# Patient Record
Sex: Female | Born: 1970 | Race: Black or African American | Hispanic: No | Smoking: Never smoker
Health system: Southern US, Community
[De-identification: ages and names within clinical notes are randomized; demographics above are authoritative.]

## PROBLEM LIST (undated history)

## (undated) DIAGNOSIS — D649 Anemia, unspecified: Secondary | ICD-10-CM

## (undated) HISTORY — DX: Anemia, unspecified: D64.9

## (undated) HISTORY — PX: TUBAL LIGATION: SHX77

## (undated) HISTORY — PX: BREAST REDUCTION SURGERY: SHX8

---

## 1996-11-27 HISTORY — PX: CHOLECYSTECTOMY: SHX55

## 2002-07-01 ENCOUNTER — Emergency Department (HOSPITAL_COMMUNITY): Admission: EM | Admit: 2002-07-01 | Discharge: 2002-07-01 | Payer: Self-pay | Admitting: Emergency Medicine

## 2002-09-13 ENCOUNTER — Emergency Department (HOSPITAL_COMMUNITY): Admission: EM | Admit: 2002-09-13 | Discharge: 2002-09-14 | Payer: Self-pay

## 2002-10-21 ENCOUNTER — Emergency Department (HOSPITAL_COMMUNITY): Admission: EM | Admit: 2002-10-21 | Discharge: 2002-10-21 | Payer: Self-pay | Admitting: Emergency Medicine

## 2002-10-21 ENCOUNTER — Encounter: Payer: Self-pay | Admitting: Emergency Medicine

## 2003-03-22 ENCOUNTER — Encounter: Payer: Self-pay | Admitting: Emergency Medicine

## 2003-03-22 ENCOUNTER — Emergency Department (HOSPITAL_COMMUNITY): Admission: EM | Admit: 2003-03-22 | Discharge: 2003-03-22 | Payer: Self-pay | Admitting: Emergency Medicine

## 2003-05-29 ENCOUNTER — Other Ambulatory Visit: Admission: RE | Admit: 2003-05-29 | Discharge: 2003-05-29 | Payer: Self-pay | Admitting: Obstetrics and Gynecology

## 2003-06-08 ENCOUNTER — Emergency Department (HOSPITAL_COMMUNITY): Admission: EM | Admit: 2003-06-08 | Discharge: 2003-06-08 | Payer: Self-pay | Admitting: Emergency Medicine

## 2003-11-29 ENCOUNTER — Emergency Department (HOSPITAL_COMMUNITY): Admission: EM | Admit: 2003-11-29 | Discharge: 2003-11-29 | Payer: Self-pay

## 2004-08-03 ENCOUNTER — Inpatient Hospital Stay (HOSPITAL_COMMUNITY): Admission: AD | Admit: 2004-08-03 | Discharge: 2004-08-03 | Payer: Self-pay | Admitting: Obstetrics & Gynecology

## 2005-09-18 ENCOUNTER — Emergency Department (HOSPITAL_COMMUNITY): Admission: EM | Admit: 2005-09-18 | Discharge: 2005-09-18 | Payer: Self-pay | Admitting: *Deleted

## 2006-05-16 ENCOUNTER — Emergency Department (HOSPITAL_COMMUNITY): Admission: EM | Admit: 2006-05-16 | Discharge: 2006-05-17 | Payer: Self-pay | Admitting: Emergency Medicine

## 2007-04-11 ENCOUNTER — Emergency Department (HOSPITAL_COMMUNITY): Admission: EM | Admit: 2007-04-11 | Discharge: 2007-04-11 | Payer: Self-pay | Admitting: Emergency Medicine

## 2008-02-28 ENCOUNTER — Emergency Department (HOSPITAL_COMMUNITY): Admission: EM | Admit: 2008-02-28 | Discharge: 2008-02-28 | Payer: Self-pay | Admitting: Emergency Medicine

## 2009-06-15 ENCOUNTER — Emergency Department (HOSPITAL_COMMUNITY): Admission: EM | Admit: 2009-06-15 | Discharge: 2009-06-16 | Payer: Self-pay | Admitting: Emergency Medicine

## 2009-06-17 ENCOUNTER — Emergency Department (HOSPITAL_COMMUNITY): Admission: EM | Admit: 2009-06-17 | Discharge: 2009-06-17 | Payer: Self-pay | Admitting: Emergency Medicine

## 2009-11-22 ENCOUNTER — Emergency Department (HOSPITAL_COMMUNITY): Admission: EM | Admit: 2009-11-22 | Discharge: 2009-11-23 | Payer: Self-pay | Admitting: Emergency Medicine

## 2010-05-02 ENCOUNTER — Other Ambulatory Visit: Payer: Self-pay

## 2010-05-02 ENCOUNTER — Emergency Department (HOSPITAL_COMMUNITY): Admission: EM | Admit: 2010-05-02 | Discharge: 2010-05-02 | Payer: Self-pay | Admitting: Emergency Medicine

## 2010-05-02 ENCOUNTER — Other Ambulatory Visit: Payer: Self-pay | Admitting: Emergency Medicine

## 2010-11-23 ENCOUNTER — Inpatient Hospital Stay (HOSPITAL_COMMUNITY)
Admission: AD | Admit: 2010-11-23 | Discharge: 2010-11-23 | Payer: Self-pay | Source: Home / Self Care | Attending: Obstetrics & Gynecology | Admitting: Obstetrics & Gynecology

## 2011-02-06 LAB — CBC
HCT: 35.1 % — ABNORMAL LOW (ref 36.0–46.0)
MCH: 28.8 pg (ref 26.0–34.0)
MCV: 88.6 fL (ref 78.0–100.0)
Platelets: 304 10*3/uL (ref 150–400)
RBC: 3.96 MIL/uL (ref 3.87–5.11)
WBC: 5.5 10*3/uL (ref 4.0–10.5)

## 2011-02-06 LAB — URINALYSIS, ROUTINE W REFLEX MICROSCOPIC
Hgb urine dipstick: NEGATIVE
Ketones, ur: NEGATIVE mg/dL
Specific Gravity, Urine: 1.015 (ref 1.005–1.030)

## 2011-02-06 LAB — WET PREP, GENITAL

## 2011-02-06 LAB — POCT PREGNANCY, URINE: Preg Test, Ur: NEGATIVE

## 2011-02-06 LAB — GC/CHLAMYDIA PROBE AMP, GENITAL: Chlamydia, DNA Probe: NEGATIVE

## 2011-02-13 LAB — WET PREP, GENITAL: Trich, Wet Prep: NONE SEEN

## 2011-02-13 LAB — DIFFERENTIAL
Basophils Absolute: 0.1 10*3/uL (ref 0.0–0.1)
Basophils Relative: 2 % — ABNORMAL HIGH (ref 0–1)
Eosinophils Absolute: 0.1 10*3/uL (ref 0.0–0.7)
Lymphocytes Relative: 40 % (ref 12–46)
Lymphs Abs: 2.9 10*3/uL (ref 0.7–4.0)
Monocytes Relative: 10 % (ref 3–12)
Neutrophils Relative %: 48 % (ref 43–77)

## 2011-02-13 LAB — POCT I-STAT, CHEM 8
BUN: 7 mg/dL (ref 6–23)
Creatinine, Ser: 0.7 mg/dL (ref 0.4–1.2)
Glucose, Bld: 92 mg/dL (ref 70–99)
HCT: 35 % — ABNORMAL LOW (ref 36.0–46.0)
TCO2: 27 mmol/L (ref 0–100)

## 2011-02-13 LAB — URINALYSIS, ROUTINE W REFLEX MICROSCOPIC
Ketones, ur: NEGATIVE mg/dL
Protein, ur: NEGATIVE mg/dL
Specific Gravity, Urine: 1.01 (ref 1.005–1.030)

## 2011-02-13 LAB — CBC
Platelets: 326 10*3/uL (ref 150–400)
RBC: 3.72 MIL/uL — ABNORMAL LOW (ref 3.87–5.11)
WBC: 7.3 10*3/uL (ref 4.0–10.5)

## 2011-02-27 LAB — DIFFERENTIAL
Basophils Absolute: 0.1 10*3/uL (ref 0.0–0.1)
Basophils Relative: 1 % (ref 0–1)
Eosinophils Relative: 0 % (ref 0–5)
Lymphs Abs: 1.2 10*3/uL (ref 0.7–4.0)
Monocytes Absolute: 0.5 10*3/uL (ref 0.1–1.0)
Monocytes Relative: 6 % (ref 3–12)

## 2011-02-27 LAB — URINALYSIS, ROUTINE W REFLEX MICROSCOPIC
Glucose, UA: NEGATIVE mg/dL
Hgb urine dipstick: NEGATIVE
Protein, ur: NEGATIVE mg/dL
pH: 6 (ref 5.0–8.0)

## 2011-02-27 LAB — COMPREHENSIVE METABOLIC PANEL
Albumin: 3.4 g/dL — ABNORMAL LOW (ref 3.5–5.2)
CO2: 27 mEq/L (ref 19–32)
Chloride: 103 mEq/L (ref 96–112)
GFR calc Af Amer: >60 mL/min (ref >60)
GFR calc non Af Amer: >60 mL/min (ref >60)
Total Bilirubin: 0.7 mg/dL (ref 0.3–1.2)

## 2011-02-27 LAB — CBC
MCHC: 33.6 g/dL (ref 30.0–36.0)
MCV: 88 fL (ref 78.0–100.0)
Platelets: 305 10*3/uL (ref 150–400)

## 2011-02-27 LAB — POCT PREGNANCY, URINE: Preg Test, Ur: NEGATIVE

## 2011-03-05 LAB — COMPREHENSIVE METABOLIC PANEL WITH GFR
ALT: 16 U/L (ref 0–35)
AST: 24 U/L (ref 0–37)
Albumin: 3.6 g/dL (ref 3.5–5.2)
Alkaline Phosphatase: 72 U/L (ref 39–117)
BUN: 5 mg/dL — ABNORMAL LOW (ref 6–23)
CO2: 27 meq/L (ref 19–32)
Calcium: 9.2 mg/dL (ref 8.4–10.5)
Chloride: 104 meq/L (ref 96–112)
Creatinine, Ser: 0.78 mg/dL (ref 0.4–1.2)
GFR calc non Af Amer: >60 mL/min
Glucose, Bld: 103 mg/dL — ABNORMAL HIGH (ref 70–99)
Potassium: 4 meq/L (ref 3.5–5.1)
Sodium: 138 meq/L (ref 135–145)
Total Bilirubin: 0.3 mg/dL (ref 0.3–1.2)
Total Protein: 7.5 g/dL (ref 6.0–8.3)

## 2011-03-05 LAB — URINALYSIS, ROUTINE W REFLEX MICROSCOPIC
Glucose, UA: NEGATIVE mg/dL
Glucose, UA: NEGATIVE mg/dL
Specific Gravity, Urine: 1.008 (ref 1.005–1.030)
Urobilinogen, UA: 1 mg/dL (ref 0.0–1.0)
pH: 6.5 (ref 5.0–8.0)

## 2011-03-05 LAB — DIFFERENTIAL
Basophils Absolute: 0.1 K/uL (ref 0.0–0.1)
Basophils Relative: 1 % (ref 0–1)
Eosinophils Absolute: 0.1 K/uL (ref 0.0–0.7)
Eosinophils Relative: 1 % (ref 0–5)
Eosinophils Relative: 1 % (ref 0–5)
Lymphocytes Relative: 34 % (ref 12–46)
Lymphocytes Relative: 35 % (ref 12–46)
Lymphs Abs: 2.8 10*3/uL (ref 0.7–4.0)
Lymphs Abs: 3.7 K/uL (ref 0.7–4.0)
Monocytes Absolute: 0.9 K/uL (ref 0.1–1.0)
Monocytes Relative: 8 % (ref 3–12)
Neutro Abs: 4.7 10*3/uL (ref 1.7–7.7)
Neutro Abs: 5.8 K/uL (ref 1.7–7.7)
Neutrophils Relative %: 55 % (ref 43–77)
Neutrophils Relative %: 58 % (ref 43–77)

## 2011-03-05 LAB — URINE CULTURE
Colony Count: NO GROWTH
Culture: NO GROWTH

## 2011-03-05 LAB — CBC
HCT: 35.8 % — ABNORMAL LOW (ref 36.0–46.0)
Hemoglobin: 11.8 g/dL — ABNORMAL LOW (ref 12.0–15.0)
MCHC: 33 g/dL (ref 30.0–36.0)
MCHC: 33.1 g/dL (ref 30.0–36.0)
MCV: 88.7 fL (ref 78.0–100.0)
MCV: 88.8 fL (ref 78.0–100.0)
Platelets: 374 10*3/uL (ref 150–400)
RBC: 3.85 MIL/uL — ABNORMAL LOW (ref 3.87–5.11)
RBC: 4.03 MIL/uL (ref 3.87–5.11)

## 2011-03-05 LAB — BASIC METABOLIC PANEL WITH GFR
BUN: 8 mg/dL (ref 6–23)
CO2: 29 meq/L (ref 19–32)
Calcium: 9.3 mg/dL (ref 8.4–10.5)
Chloride: 102 meq/L (ref 96–112)
Creatinine, Ser: 0.74 mg/dL (ref 0.4–1.2)
GFR calc non Af Amer: >60 mL/min
Glucose, Bld: 106 mg/dL — ABNORMAL HIGH (ref 70–99)
Potassium: 5.2 meq/L — ABNORMAL HIGH (ref 3.5–5.1)
Sodium: 137 meq/L (ref 135–145)

## 2011-03-05 LAB — URINE MICROSCOPIC-ADD ON

## 2011-03-05 LAB — PREGNANCY, URINE: Preg Test, Ur: NEGATIVE

## 2011-06-28 ENCOUNTER — Emergency Department (HOSPITAL_COMMUNITY)
Admission: EM | Admit: 2011-06-28 | Discharge: 2011-06-28 | Disposition: A | Discharge status: Home / Self Care | Payer: Self-pay | Attending: Emergency Medicine | Admitting: Emergency Medicine

## 2011-06-28 DIAGNOSIS — R109 Unspecified abdominal pain: Secondary | ICD-10-CM | POA: Insufficient documentation

## 2011-06-28 DIAGNOSIS — N390 Urinary tract infection, site not specified: Secondary | ICD-10-CM | POA: Insufficient documentation

## 2011-06-28 DIAGNOSIS — R110 Nausea: Secondary | ICD-10-CM | POA: Insufficient documentation

## 2011-06-28 DIAGNOSIS — R300 Dysuria: Secondary | ICD-10-CM | POA: Insufficient documentation

## 2011-06-28 LAB — BASIC METABOLIC PANEL
CO2: 29 mEq/L (ref 19–32)
Chloride: 100 mEq/L (ref 96–112)
Creatinine, Ser: 0.6 mg/dL (ref 0.50–1.10)
GFR calc Af Amer: >60 mL/min (ref >60)
Potassium: 3.5 mEq/L (ref 3.5–5.1)

## 2011-06-28 LAB — URINALYSIS, ROUTINE W REFLEX MICROSCOPIC
Glucose, UA: NEGATIVE mg/dL
Hgb urine dipstick: NEGATIVE
Ketones, ur: NEGATIVE mg/dL
Protein, ur: NEGATIVE mg/dL

## 2011-06-28 LAB — CBC
HCT: 34.7 % — ABNORMAL LOW (ref 36.0–46.0)
MCV: 87.6 fL (ref 78.0–100.0)
Platelets: 382 10*3/uL (ref 150–400)
RBC: 3.96 MIL/uL (ref 3.87–5.11)
WBC: 9.7 10*3/uL (ref 4.0–10.5)

## 2011-06-28 LAB — DIFFERENTIAL
Basophils Absolute: 0 10*3/uL (ref 0.0–0.1)
Eosinophils Absolute: 0.1 10*3/uL (ref 0.0–0.7)
Lymphocytes Relative: 36 % (ref 12–46)
Lymphs Abs: 3.5 10*3/uL (ref 0.7–4.0)
Neutrophils Relative %: 55 % (ref 43–77)

## 2011-06-28 LAB — URINE MICROSCOPIC-ADD ON

## 2011-06-30 LAB — URINE CULTURE
Colony Count: >=100000
Culture  Setup Time: 201208012137

## 2011-08-22 LAB — URINALYSIS, ROUTINE W REFLEX MICROSCOPIC
Bilirubin Urine: NEGATIVE
Glucose, UA: NEGATIVE
Hgb urine dipstick: NEGATIVE
Ketones, ur: NEGATIVE
Specific Gravity, Urine: 1.018
pH: 6

## 2011-08-22 LAB — COMPREHENSIVE METABOLIC PANEL
ALT: 17
BUN: 6
CO2: 29
Calcium: 9.6
GFR calc non Af Amer: >60
Glucose, Bld: 71
Sodium: 140
Total Protein: 7.5

## 2011-08-22 LAB — CBC
HCT: 36.1
Hemoglobin: 12.1
MCHC: 33.6
MCV: 87.2
RBC: 4.14
RDW: 12.6

## 2011-08-22 LAB — DIFFERENTIAL
Basophils Relative: 1
Eosinophils Absolute: 0.1
Lymphs Abs: 2.6
Monocytes Relative: 9
Neutro Abs: 2.4
Neutrophils Relative %: 43

## 2011-08-22 LAB — PREGNANCY, URINE: Preg Test, Ur: NEGATIVE

## 2011-08-22 LAB — LIPASE, BLOOD: Lipase: 27

## 2011-08-25 ENCOUNTER — Encounter (HOSPITAL_COMMUNITY): Payer: Self-pay | Admitting: *Deleted

## 2011-08-25 ENCOUNTER — Inpatient Hospital Stay (HOSPITAL_COMMUNITY)
Admission: AD | Admit: 2011-08-25 | Discharge: 2011-08-25 | Disposition: A | Discharge status: Home / Self Care | Payer: Self-pay | Source: Ambulatory Visit | Attending: Obstetrics & Gynecology | Admitting: Obstetrics & Gynecology

## 2011-08-25 DIAGNOSIS — N760 Acute vaginitis: Secondary | ICD-10-CM | POA: Insufficient documentation

## 2011-08-25 DIAGNOSIS — A499 Bacterial infection, unspecified: Secondary | ICD-10-CM

## 2011-08-25 DIAGNOSIS — N949 Unspecified condition associated with female genital organs and menstrual cycle: Secondary | ICD-10-CM | POA: Insufficient documentation

## 2011-08-25 DIAGNOSIS — B9689 Other specified bacterial agents as the cause of diseases classified elsewhere: Secondary | ICD-10-CM | POA: Insufficient documentation

## 2011-08-25 LAB — WET PREP, GENITAL: Yeast Wet Prep HPF POC: NONE SEEN

## 2011-08-25 LAB — URINALYSIS, ROUTINE W REFLEX MICROSCOPIC
Ketones, ur: NEGATIVE mg/dL
Leukocytes, UA: NEGATIVE
Nitrite: NEGATIVE
Protein, ur: NEGATIVE mg/dL
pH: 6 (ref 5.0–8.0)

## 2011-08-25 MED ORDER — METRONIDAZOLE 500 MG PO TABS: 500.0000 mg | ORAL_TABLET | Freq: Two times a day (BID) | ORAL | Status: AC

## 2011-08-25 NOTE — Progress Notes (Signed)
Cloudy, fishy smelling urine. Painful intercourse. Started about 2 months ago. Was seen at [facility] about 4 wks ago and was treated for UTI. SXS seemed to go away, but now has come back. Has an odorous D/C and has to wear a panty liner.

## 2011-08-25 NOTE — ED Provider Notes (Signed)
History   Pt presents today c/o vag dc with odor for the past 2 months. She states she thinks she has a UTI because she had similar sx in the past and was recently tx at [facility] for the same. She denies dysuria, fever, vag bleeding. She does report some pain with intercourse.   Chief Complaint  Patient presents with  . Vaginal Discharge   HPI  OB History    Grav Para Term Preterm Abortions TAB SAB Ect Mult Living   4 4 4  0 0 0 0 0 0 4      Past Medical History  Diagnosis Date  . Urinary tract infection     Past Surgical History  Procedure Date  . Cholecystectomy   . Breast reduction surgery   . Tubal ligation     No family history on file.  History  Substance Use Topics  . Smoking status: Not on file  . Smokeless tobacco: Never Used  . Alcohol Use: No    Allergies: Allergies not on file  No prescriptions prior to admission    Review of Systems  Constitutional: Negative for fever.  Cardiovascular: Negative for chest pain.  Gastrointestinal: Negative for nausea, vomiting, abdominal pain, diarrhea and constipation.  Genitourinary: Negative for dysuria, urgency, frequency and hematuria.  Neurological: Negative for dizziness and headaches.  Psychiatric/Behavioral: Negative for depression and suicidal ideas.   Physical Exam   Blood pressure 110/66, pulse 75, temperature 100 F (37.8 C), temperature source Oral, resp. rate 18, height 5\' 6"  (1.676 m), weight 210 lb (95.255 kg), last menstrual period 08/11/2011.  Physical Exam  Constitutional: She is oriented to person, place, and time. She appears well-developed and well-nourished. No distress.  HENT:  Head: Normocephalic and atraumatic.  Eyes: EOM are normal. Pupils are equal, round, and reactive to light.  GI: Soft. She exhibits no distension and no mass. There is no tenderness. There is no rebound and no guarding.  Genitourinary: Uterus normal. No bleeding around the vagina. Vaginal discharge found.       Uterus  NL size and shape. No adnexal masses.  Neurological: She is alert and oriented to person, place, and time.  Skin: Skin is warm and dry. She is not diaphoretic.  Psychiatric: She has a normal mood and affect. Her behavior is normal. Judgment and thought content normal.    MAU Course  Procedures  Wet prep and GC/Chlamydia cultures done.  Results for orders placed during the hospital encounter of 08/25/11 (from the past 24 hour(s))  URINALYSIS, ROUTINE W REFLEX MICROSCOPIC     Status: Normal   Collection Time   08/25/11 12:56 PM      Component Value Range   Color, Urine YELLOW  YELLOW    Appearance CLEAR  CLEAR    Specific Gravity, Urine 1.010  1.005 - 1.030    pH 6.0  5.0 - 8.0    Glucose, UA NEGATIVE  NEGATIVE (mg/dL)   Hgb urine dipstick NEGATIVE  NEGATIVE    Bilirubin Urine NEGATIVE  NEGATIVE    Ketones, ur NEGATIVE  NEGATIVE (mg/dL)   Protein, ur NEGATIVE  NEGATIVE (mg/dL)   Urobilinogen, UA 0.2  0.0 - 1.0 (mg/dL)   Nitrite NEGATIVE  NEGATIVE    Leukocytes, UA NEGATIVE  NEGATIVE   WET PREP, GENITAL     Status: Abnormal   Collection Time   08/25/11  1:22 PM      Component Value Range   Yeast, Wet Prep NONE SEEN  NONE SEEN  Trich, Wet Prep NONE SEEN  NONE SEEN    Clue Cells, Wet Prep MODERATE (*) NONE SEEN    WBC, Wet Prep HPF POC NONE SEEN  NONE SEEN      Assessment and Plan  BV: discussed with pt at length. Will tx with Flagyl. Warned of antabuse reaction. Discussed diet, activity, risks, and precautions.  Clinton Gallant. Jayr Lupercio III, DrHSc, MPAS, PA-C  08/25/2011, 1:22 PM   Henrietta Hoover, PA 08/25/11 1348

## 2011-08-27 NOTE — ED Provider Notes (Signed)
Agree with above note.  Desmin Daleo H. 08/27/2011 2:58 PM

## 2011-10-24 ENCOUNTER — Emergency Department (HOSPITAL_COMMUNITY)
Admission: EM | Admit: 2011-10-24 | Discharge: 2011-10-24 | Disposition: A | Discharge status: Home / Self Care | Payer: Self-pay | Attending: Emergency Medicine | Admitting: Emergency Medicine

## 2011-10-24 ENCOUNTER — Encounter (HOSPITAL_COMMUNITY): Payer: Self-pay | Admitting: *Deleted

## 2011-10-24 ENCOUNTER — Emergency Department (HOSPITAL_COMMUNITY): Payer: Self-pay

## 2011-10-24 DIAGNOSIS — J111 Influenza due to unidentified influenza virus with other respiratory manifestations: Secondary | ICD-10-CM | POA: Insufficient documentation

## 2011-10-24 DIAGNOSIS — J40 Bronchitis, not specified as acute or chronic: Secondary | ICD-10-CM | POA: Insufficient documentation

## 2011-10-24 MED ORDER — ALBUTEROL SULFATE HFA 108 (90 BASE) MCG/ACT IN AERS
2.0000 | INHALATION_SPRAY | RESPIRATORY_TRACT | Status: DC | PRN
  Administered 2011-10-24: 2 via RESPIRATORY_TRACT
  Filled 2011-10-24: qty 6.7

## 2011-10-24 MED ORDER — AZITHROMYCIN 250 MG PO TABS: 250.0000 mg | ORAL_TABLET | Freq: Every day | ORAL | Status: DC

## 2011-10-24 MED ORDER — IPRATROPIUM BROMIDE 0.02 % IN SOLN
0.5000 mg | Freq: Once | RESPIRATORY_TRACT | Status: AC
  Administered 2011-10-24: 0.5 mg via RESPIRATORY_TRACT
  Filled 2011-10-24: qty 2.5

## 2011-10-24 MED ORDER — ALBUTEROL SULFATE (5 MG/ML) 0.5% IN NEBU
5.0000 mg | INHALATION_SOLUTION | Freq: Once | RESPIRATORY_TRACT | Status: AC
  Administered 2011-10-24: 5 mg via RESPIRATORY_TRACT
  Filled 2011-10-24: qty 1

## 2011-10-24 NOTE — ED Notes (Signed)
Pt c/o of fever, weakness, cough since Saturday. Pt denies n/v/d.

## 2011-10-24 NOTE — ED Provider Notes (Signed)
History     CSN: [id number] Arrival date & time: 10/24/2011  4:31 PM   First MD Initiated Contact with Patient 10/24/11 1753      Chief Complaint  Patient presents with  . URI    pt c/o cough, chills, fever, headache since sunday. pt denies n/v.     (Consider location/radiation/quality/duration/timing/severity/associated sxs/prior treatment) Patient is a 40 y.o. female presenting with URI. No language interpreter was used.  URI The primary symptoms include fever, fatigue, headaches, ear pain, cough and myalgias. Primary symptoms do not include sore throat, swollen glands, wheezing, abdominal pain, nausea, vomiting or rash. The current episode started 3 to 5 days ago. This is a new problem. The problem has been gradually worsening.    Past Medical History  Diagnosis Date  . Urinary tract infection     Past Surgical History  Procedure Date  . Cholecystectomy   . Breast reduction surgery   . Tubal ligation     History reviewed. No pertinent family history.  History  Substance Use Topics  . Smoking status: Not on file  . Smokeless tobacco: Never Used  . Alcohol Use: No    OB History    Grav Para Term Preterm Abortions TAB SAB Ect Mult Living   4 4 4  0 0 0 0 0 0 4      Review of Systems  Constitutional: Positive for fever and fatigue.  HENT: Positive for ear pain. Negative for sore throat.   Respiratory: Positive for cough. Negative for wheezing.   Gastrointestinal: Negative for nausea, vomiting and abdominal pain.  Musculoskeletal: Positive for myalgias.  Skin: Negative for rash.  Neurological: Positive for headaches.  All other systems reviewed and are negative.    Allergies  Review of patient's allergies indicates no known allergies.  Home Medications   Current Outpatient Rx  Name Route Sig Dispense Refill  . ONE-DAILY MULTI VITAMINS PO TABS Oral Take 1 tablet by mouth daily.      . NYQUIL PO Oral Take 30 mLs by mouth at bedtime as needed. For  sleep/decongestant.      BP 104/59  Pulse 90  Temp(Src) 99.9 F (37.7 C) (Oral)  Resp 18  Wt 204 lb (92.534 kg)  SpO2 100%  Physical Exam  Nursing note and vitals reviewed. Constitutional: She is oriented to person, place, and time. She appears well-developed and well-nourished. No distress.  Eyes: Pupils are equal, round, and reactive to light.  Neck: Normal range of motion.  Cardiovascular: Normal rate.   Pulmonary/Chest: Effort normal. No respiratory distress. She has no wheezes. She has no rales. She exhibits no tenderness.  Abdominal: Soft. Bowel sounds are normal. There is no tenderness.  Musculoskeletal: She exhibits tenderness. She exhibits no edema.  Neurological: She is alert and oriented to person, place, and time.  Skin: Skin is warm and dry. She is not diaphoretic.  Psychiatric: She has a normal mood and affect.    ED Course  Procedures (including critical care time)  Labs Reviewed - No data to display Dg Chest 2 View  10/24/2011  *RADIOLOGY REPORT*  Clinical Data: Cough and fever.  CHEST - 2 VIEW  Comparison: Chest x-ray 06/17/2009.  Findings: The cardiac silhouette, mediastinal and hilar contours are within normal limits.  There are mild bronchitic type lung changes suggesting bronchitis.  No focal infiltrates, edema or effusions.  The bony thorax is intact.  IMPRESSION: Findings suggest bronchitis.  No focal infiltrates.  Original Report Authenticated By: P. Loralie Champagne, M.D.  No diagnosis found.    MDM  Cough and fever x 3 days with general malaise and muscle tenderness.  Will give rx for z-pack to fill if not better in 24 hours.  Inhaler also given.  Will follow up with PCP if not better in 2 days.  No pneumonia on chest x-ray.  Bronchitis. Suspect influenza.   Medical screening examination/treatment/procedure(s) were performed by non-physician practitioner and as supervising physician I was immediately available for consultation/collaboration. Osvaldo Human, M.D.      Jethro Bastos, NP 10/24/11 1949  Carleene Cooper III, MD 10/25/11 [phone]

## 2011-12-26 IMAGING — US US ART/VEN ABD/PELV/SCROTUM DOPPLER COMPLETE
1 series · 14 of 25 positions shown · non-contrast
Comparison: None

CLINICAL DATA: Right low back pain.

TRANSABDOMINAL AND TRANSVAGINAL ULTRASOUND OF PELVIS
DOPPLER ULTRASOUND OF OVARIES
TECHNIQUE: Both transabdominal and transvaginal ultrasound
examinations of the pelvis were performed including evaluation of
the uterus, ovaries, adnexal regions, and pelvic cul-de-sac. Color
and duplex Doppler ultrasound was utilized to evaluate blood flow
to the ovaries.

[Series 1: us art/ven abd/pelv/scrotum doppler complete · 0.28mm/px · 14 of 49 slices shown]
[im 1/49]
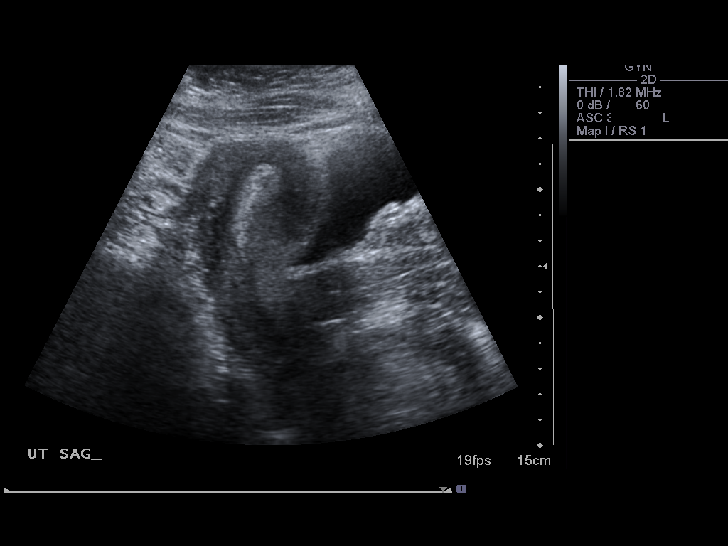
[im 5/49]
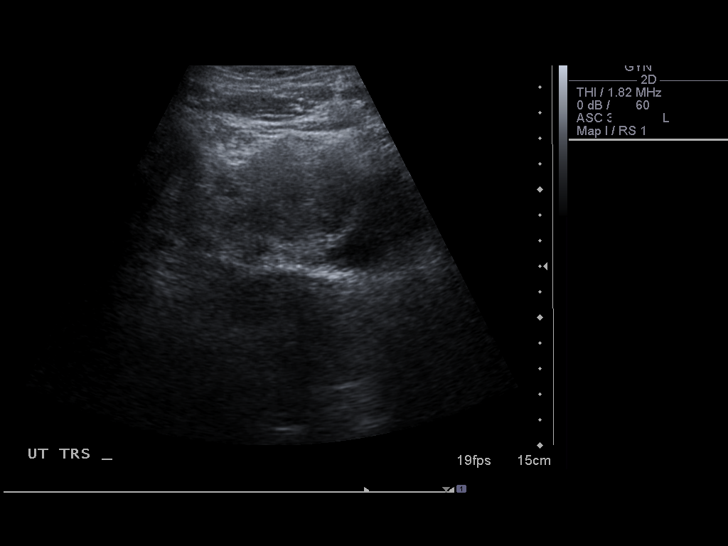
[im 9/49]
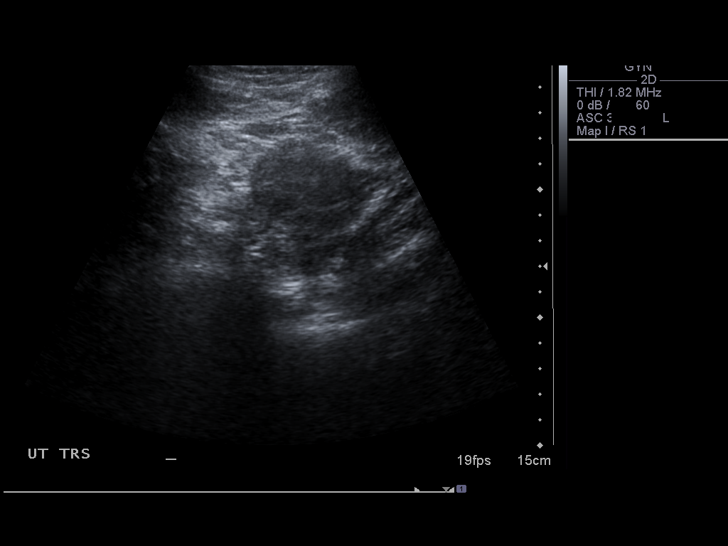
[im 13/49]
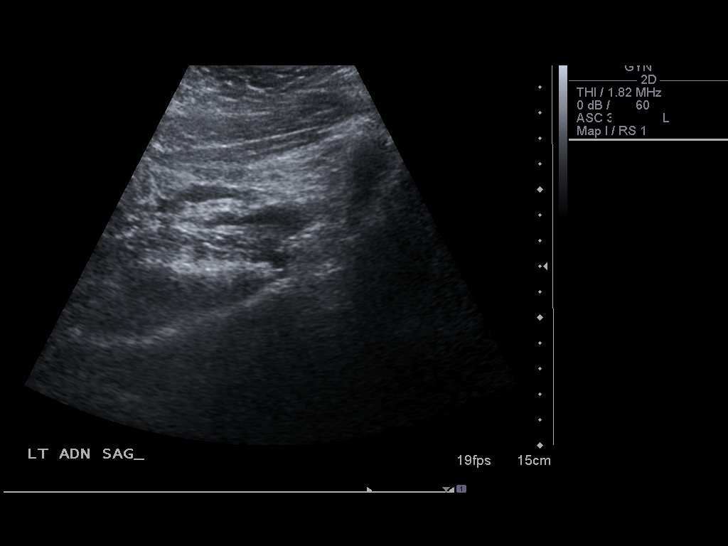
[im 17/49]
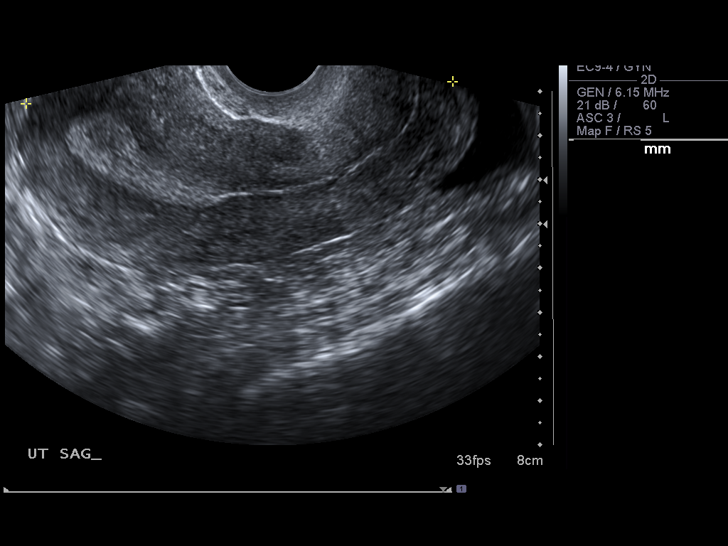
[im 19/49]
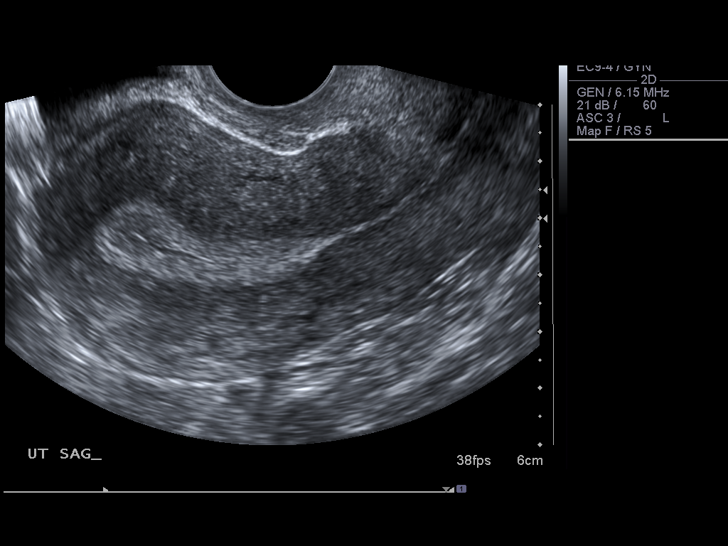
[im 23/49]
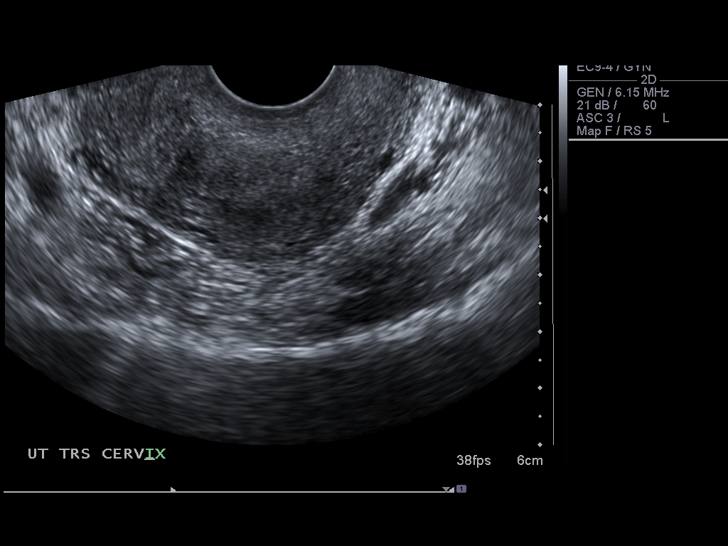
[im 27/49]
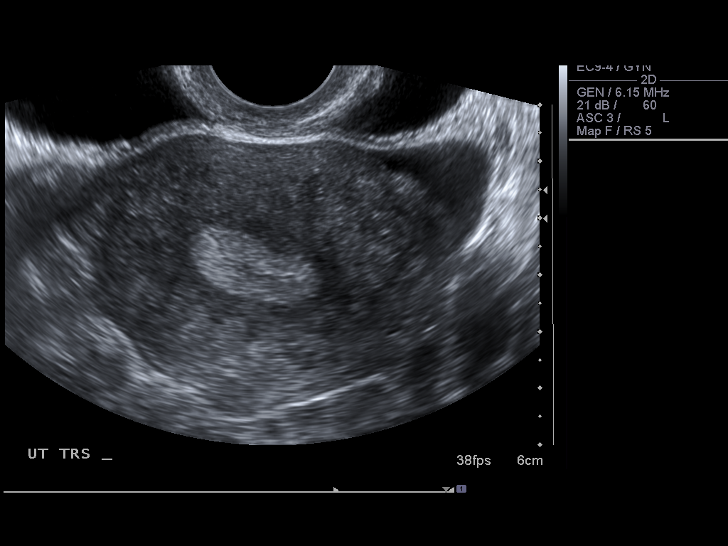
[im 31/49]
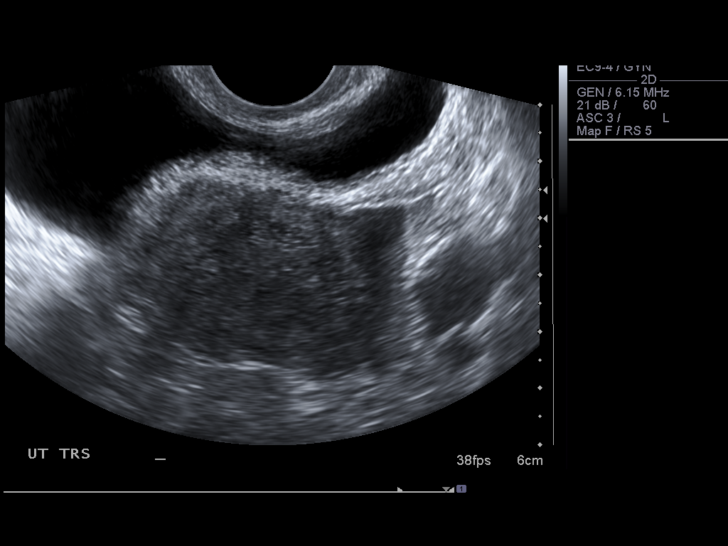
[im 33/49]
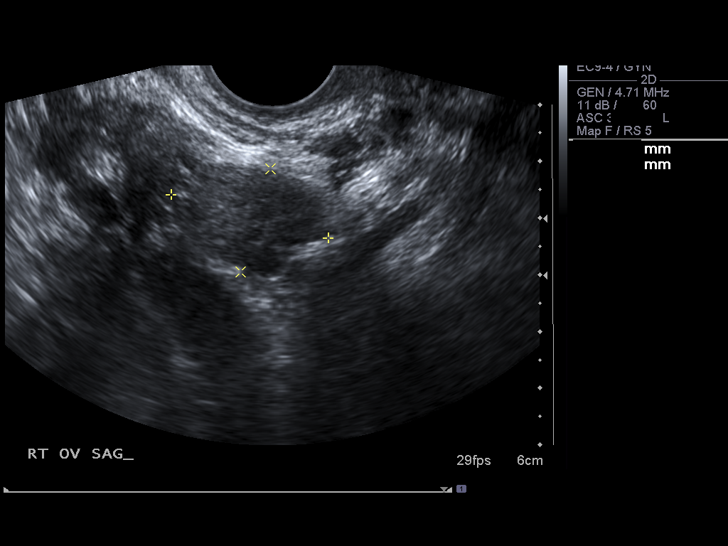
[im 37/49]
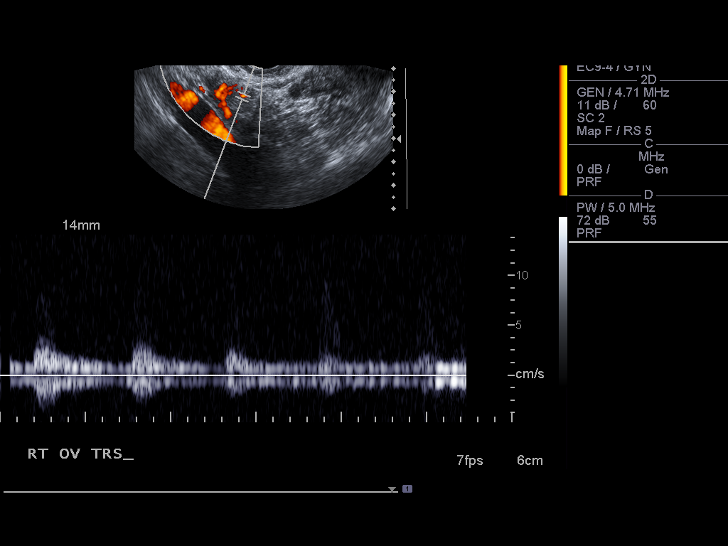
[im 41/49]
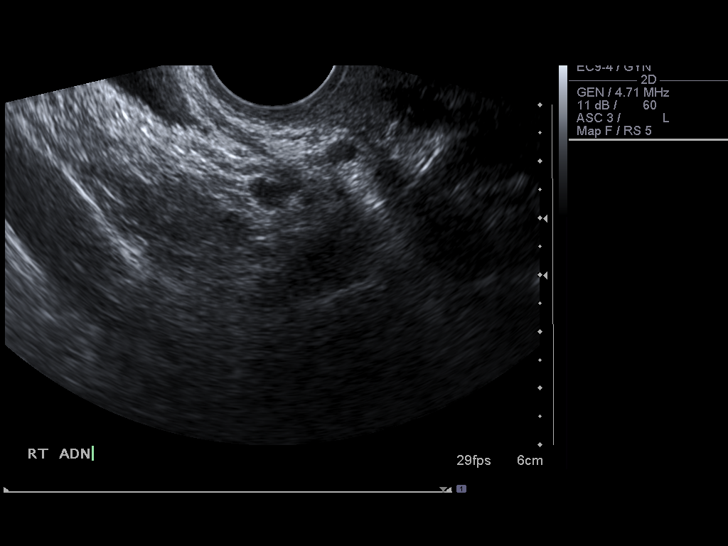
[im 45/49]
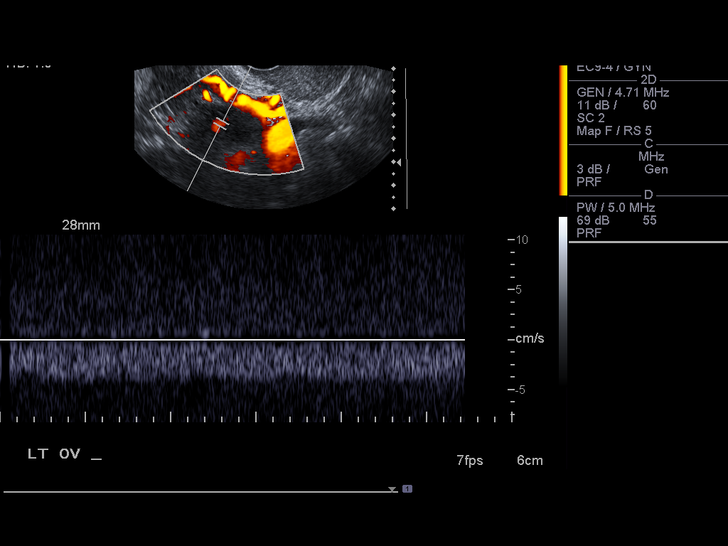
[im 49/49]
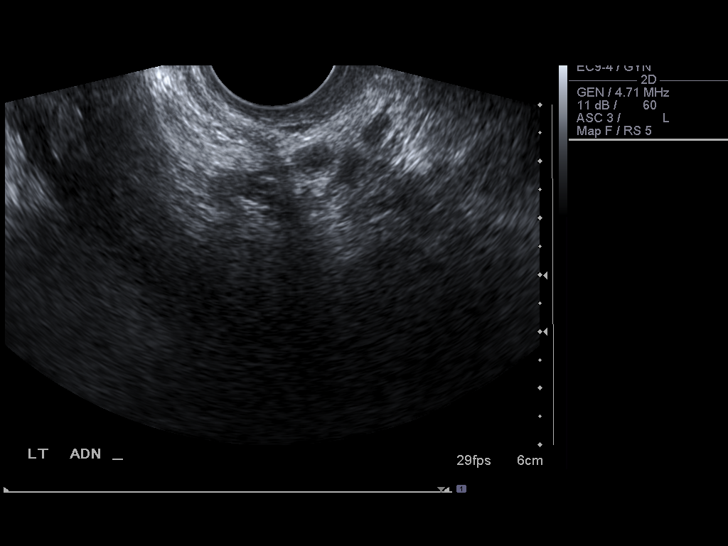

[14 of 25 positions shown; findings below may reference images not displayed]

FINDINGS: Uterus: 9.7 x 4.8 x 6.2 cm.  No focal abnormality.  Normal
echotexture.

Endometrium: Upper limits normal in thickness at 13 mm.  Small
amount of fluid within the fundus.

Right Ovary: 2.9 x 1.9 x 1.7 cm. Normal size and echotexture.  No
adnexal masses. Arterial and venous blood flow documented.

Left Ovary: 4.2 x 1.4 x 2.3 cm.  Arterial and venous blood flow
documented.  Complex cystic area within the left ovary measures
cm, possibly ruptured / collapsing cyst.

Other Findings:  Small amount of free fluid throughout the pelvis.
IMPRESSION: No evidence of ovarian torsion.

Suspect ruptured / collapsing cyst in the left ovary.  Small amount
of free fluid in the pelvis.

## 2012-10-08 ENCOUNTER — Encounter (HOSPITAL_COMMUNITY): Payer: Self-pay

## 2012-10-08 ENCOUNTER — Emergency Department (HOSPITAL_COMMUNITY): Payer: Self-pay

## 2012-10-08 ENCOUNTER — Emergency Department (HOSPITAL_COMMUNITY)
Admission: EM | Admit: 2012-10-08 | Discharge: 2012-10-09 | Disposition: A | Discharge status: Home / Self Care | Payer: Self-pay | Attending: Emergency Medicine | Admitting: Emergency Medicine

## 2012-10-08 DIAGNOSIS — K5289 Other specified noninfective gastroenteritis and colitis: Secondary | ICD-10-CM | POA: Insufficient documentation

## 2012-10-08 DIAGNOSIS — N390 Urinary tract infection, site not specified: Secondary | ICD-10-CM | POA: Insufficient documentation

## 2012-10-08 DIAGNOSIS — K529 Noninfective gastroenteritis and colitis, unspecified: Secondary | ICD-10-CM

## 2012-10-08 LAB — URINALYSIS, ROUTINE W REFLEX MICROSCOPIC
Glucose, UA: NEGATIVE mg/dL
Ketones, ur: NEGATIVE mg/dL
pH: 6 (ref 5.0–8.0)

## 2012-10-08 LAB — CBC WITH DIFFERENTIAL/PLATELET
HCT: 33.5 % — ABNORMAL LOW (ref 36.0–46.0)
Hemoglobin: 10.7 g/dL — ABNORMAL LOW (ref 12.0–15.0)
Lymphocytes Relative: 34 % (ref 12–46)
MCHC: 31.9 g/dL (ref 30.0–36.0)
Monocytes Absolute: 0.9 10*3/uL (ref 0.1–1.0)
Monocytes Relative: 9 % (ref 3–12)
Neutro Abs: 6 10*3/uL (ref 1.7–7.7)
WBC: 10.6 10*3/uL — ABNORMAL HIGH (ref 4.0–10.5)

## 2012-10-08 LAB — COMPREHENSIVE METABOLIC PANEL
Alkaline Phosphatase: 81 U/L (ref 39–117)
BUN: 7 mg/dL (ref 6–23)
CO2: 27 mEq/L (ref 19–32)
Chloride: 102 mEq/L (ref 96–112)
GFR calc Af Amer: >90 mL/min (ref >90)
Glucose, Bld: 92 mg/dL (ref 70–99)
Potassium: 4.1 mEq/L (ref 3.5–5.1)
Total Bilirubin: 0.3 mg/dL (ref 0.3–1.2)

## 2012-10-08 LAB — URINE MICROSCOPIC-ADD ON

## 2012-10-08 MED ORDER — METRONIDAZOLE 500 MG PO TABS
500.0000 mg | ORAL_TABLET | Freq: Once | ORAL | Status: AC
  Administered 2012-10-08: 500 mg via ORAL
  Filled 2012-10-08: qty 1

## 2012-10-08 MED ORDER — CIPROFLOXACIN HCL 500 MG PO TABS: 500.0000 mg | ORAL_TABLET | Freq: Two times a day (BID) | ORAL | Status: DC

## 2012-10-08 MED ORDER — ONDANSETRON HCL 4 MG/2ML IJ SOLN
4.0000 mg | Freq: Once | INTRAMUSCULAR | Status: AC
  Administered 2012-10-08: 4 mg via INTRAVENOUS
  Filled 2012-10-08: qty 2

## 2012-10-08 MED ORDER — FENTANYL CITRATE 0.05 MG/ML IJ SOLN
100.0000 ug | Freq: Once | INTRAMUSCULAR | Status: AC
  Administered 2012-10-08: 100 ug via INTRAVENOUS
  Filled 2012-10-08: qty 2

## 2012-10-08 MED ORDER — METRONIDAZOLE 500 MG PO TABS: 500.0000 mg | ORAL_TABLET | Freq: Two times a day (BID) | ORAL | Status: DC

## 2012-10-08 MED ORDER — CIPROFLOXACIN HCL 500 MG PO TABS
500.0000 mg | ORAL_TABLET | Freq: Once | ORAL | Status: AC
  Administered 2012-10-08: 500 mg via ORAL
  Filled 2012-10-08: qty 1

## 2012-10-08 NOTE — ED Notes (Signed)
Pt presents with NAD- Pt c/ of left flank pain that radiates to left lower stomach- ?UTI- denies N/V/D and fever

## 2012-10-08 NOTE — ED Provider Notes (Signed)
History     CSN: [id number]  Arrival date & time 10/08/12  1711   First MD Initiated Contact with Patient 10/08/12 1955      Chief Complaint  Patient presents with  . Urinary Tract Infection  . Flank Pain    (Consider location/radiation/quality/duration/timing/severity/associated sxs/prior treatment) HPI Comments: Brooke Hoffman is a 41 y.o. Female who presents for evaluation of left flank pain. The pain has been present for 3 days, and is constant. She has urinary urgency, but no dysuria, or urinary frequency. She denies fever, chills, nausea, vomiting, or diarrhea. No change in bowel habits. She's been able to eat. There are no modifying factors. She's not had this previously.  Patient is a 41 y.o. female presenting with urinary tract infection and flank pain. The history is provided by the patient.  Urinary Tract Infection  Flank Pain    Past Medical History  Diagnosis Date  . Urinary tract infection     Past Surgical History  Procedure Date  . Cholecystectomy   . Breast reduction surgery   . Tubal ligation     History reviewed. No pertinent family history.  History  Substance Use Topics  . Smoking status: Not on file  . Smokeless tobacco: Never Used  . Alcohol Use: No    OB History    Grav Para Term Preterm Abortions TAB SAB Ect Mult Living   4 4 4  0 0 0 0 0 0 4      Review of Systems  Genitourinary: Positive for flank pain.  All other systems reviewed and are negative.    Allergies  Review of patient's allergies indicates no known allergies.  Home Medications   Current Outpatient Rx  Name  Route  Sig  Dispense  Refill  . IBUPROFEN 800 MG PO TABS   Oral   Take 800 mg by mouth every 8 (eight) hours as needed. For pain.         Marland Kitchen CIPROFLOXACIN HCL 500 MG PO TABS   Oral   Take 1 tablet (500 mg total) by mouth every 12 (twelve) hours.   14 tablet   0   . METRONIDAZOLE 500 MG PO TABS   Oral   Take 1 tablet (500 mg total) by mouth 2 (two)  times daily. One po bid x 7 days   14 tablet   0     BP 100/56  Pulse 77  Temp 98.8 F (37.1 C) (Oral)  Resp 18  Ht 5\' 5"  (1.651 m)  Wt 200 lb (90.719 kg)  BMI 33.28 kg/m2  SpO2 100%  LMP 09/13/2012  Physical Exam  Nursing note and vitals reviewed. Constitutional: She is oriented to person, place, and time. She appears well-developed and well-nourished.  HENT:  Head: Normocephalic and atraumatic.  Eyes: Conjunctivae normal and EOM are normal. Pupils are equal, round, and reactive to light.  Neck: Normal range of motion and phonation normal. Neck supple.  Cardiovascular: Normal rate, regular rhythm and intact distal pulses.   Pulmonary/Chest: Effort normal and breath sounds normal. She exhibits no tenderness.  Abdominal: Soft. She exhibits no distension. There is tenderness (mild left upper quadrant). There is no guarding.  Genitourinary:       No costovertebral angle tenderness  Musculoskeletal: Normal range of motion.  Neurological: She is alert and oriented to person, place, and time. She has normal strength. She exhibits normal muscle tone.  Skin: Skin is warm and dry.  Psychiatric: She has a normal mood and affect. Her  behavior is normal. Judgment and thought content normal.    ED Course  Procedures (including critical care time)  Labs Reviewed  URINALYSIS, ROUTINE W REFLEX MICROSCOPIC - Abnormal; Notable for the following:    APPearance CLOUDY (*)     Specific Gravity, Urine 1.034 (*)     Leukocytes, UA SMALL (*)     All other components within normal limits  CBC WITH DIFFERENTIAL - Abnormal; Notable for the following:    WBC 10.6 (*)     RBC 3.86 (*)     Hemoglobin 10.7 (*)     HCT 33.5 (*)     All other components within normal limits  URINE MICROSCOPIC-ADD ON - Abnormal; Notable for the following:    Squamous Epithelial / LPF MANY (*)     Bacteria, UA MANY (*)     All other components within normal limits  LIPASE, BLOOD  COMPREHENSIVE METABOLIC PANEL    URINE CULTURE   Ct Abdomen Pelvis Wo Contrast  10/08/2012  *RADIOLOGY REPORT*  Clinical Data: Urinary tract infection.  Flank pain  CT ABDOMEN AND PELVIS WITHOUT CONTRAST  Technique:  Multidetector CT imaging of the abdomen and pelvis was performed following the standard protocol without intravenous contrast.  Comparison: 06/17/2009  Findings:  Lung bases:  Heart size appears normal.  There is no pericardial or pleural effusion.  Abdomen/pelvis:  No focal liver abnormality.  Prior cholecystectomy.  The pancreas appears within normal limits.  There is no biliary dilatation.  Normal appearance of the spleen. Both adrenal glands identified and appear normal.  Nonobstructing calculus is identified within the inferior pole of the right kidney measuring 2.7 mm, image 34.  There is a hypodense structure within the inferior pole of the left kidney.  Likely cyst but incompletely characterized without IV contrast.  No left-sided nephrolithiasis or hydronephrosis.  The urinary bladder appears normal.  Uterus is unremarkable.  There are no enlarged upper abdominal lymph nodes.  There is no pelvic or inguinal adenopathy identified.  The stomach appears normal.  The small bowel loops are unremarkable.  The appendix is visualized and appears normal.  Focal inflammatory changes are noted within the right upper quadrant of the abdomen surrounding the proximal transverse colon, image 35.  These changes include soft tissue infiltration into the transverse mesocolon.  No significant perforation or abscess formation.  Small amount of free fluid is noted within the dependent portion of the pelvis.  No abscess noted.  Bones/Musculoskeletal:  The visualized osseous structures are unremarkable.  IMPRESSION:  1.  Focal inflammatory changes within the right upper quadrant of the abdomen in the region of the proximal transverse colon and adjacent mesocolon findings are favored to represent acute diverticulitis or epiploic appendagitis.   No abscess formation identified. 2.  Nonobstructing right renal calculus.   Original Report Authenticated By: Signa Kell, M.D.    Nursing notes, applicable records and vitals reviewed.  Radiologic Images/Reports reviewed.   1. UTI (lower urinary tract infection)   2. Colitis       MDM  Left flank pain, with abnormal urine most likely consistent with urinary tract infection. No evident, kidney stone. She has an unexpected finding of colitis on CT scan. The area of inflammation is in the right upper quadrant. She does not have pain there. She has no intestinal symptoms. Doubt metabolic instability, serious bacterial infection or impending vascular collapse; the patient is stable for discharge.    The patient has been encouraged to followup with her primary care  Dr. of your choice in one week to make sure that her colitis, and urinary tract infection, are improving.   Plan: Home Medications- Cipro, Flagyl; Home Treatments- gradually advance diet; Recommended follow up- PCP of choice in 1 week          Flint Melter, MD 10/08/12 2327

## 2012-10-08 NOTE — ED Notes (Signed)
MD at bedside. 

## 2012-10-10 LAB — URINE CULTURE

## 2012-10-15 ENCOUNTER — Telehealth: Payer: Self-pay | Admitting: Internal Medicine

## 2012-10-15 NOTE — Telephone Encounter (Signed)
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

## 2012-10-16 NOTE — Telephone Encounter (Signed)
Patient called back.  She is having abdominal pain just above her umbilicus.  She was seen in the ER last week and thought to have transverse colon diverticulitis or epiploic appendagitis.  She was treated with a week of cipro and flagyl and will take the last doses tomorrow,  She has not had much relief with the abdominal pain.  Pain is worse with meals.  She has an appt with Dr. Leone Payor in December.  I have moved her appt to tomorrow with Mike Gip PA.  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  She does not have the ability to pay the $184.00 until this Friday.  I have spoke with my Director Clarnce Flock and we will make a one time exception and bill her for the appt tomorrow.  The patient is aware.

## 2012-10-17 ENCOUNTER — Other Ambulatory Visit (INDEPENDENT_AMBULATORY_CARE_PROVIDER_SITE_OTHER): Payer: Self-pay

## 2012-10-17 ENCOUNTER — Encounter: Payer: Self-pay | Admitting: Physician Assistant

## 2012-10-17 ENCOUNTER — Ambulatory Visit (INDEPENDENT_AMBULATORY_CARE_PROVIDER_SITE_OTHER): Payer: Self-pay | Admitting: Physician Assistant

## 2012-10-17 VITALS — BP 110/80 | HR 72 | Ht 65.0 in | Wt 212.0 lb

## 2012-10-17 DIAGNOSIS — R634 Abnormal weight loss: Secondary | ICD-10-CM

## 2012-10-17 DIAGNOSIS — R1011 Right upper quadrant pain: Secondary | ICD-10-CM

## 2012-10-17 DIAGNOSIS — R9389 Abnormal findings on diagnostic imaging of other specified body structures: Secondary | ICD-10-CM

## 2012-10-17 DIAGNOSIS — R1012 Left upper quadrant pain: Secondary | ICD-10-CM

## 2012-10-17 LAB — CBC WITH DIFFERENTIAL/PLATELET
Basophils Relative: 0.3 % (ref 0.0–3.0)
Eosinophils Absolute: 0.1 10*3/uL (ref 0.0–0.7)
Lymphocytes Relative: 50.6 % — ABNORMAL HIGH (ref 12.0–46.0)
MCHC: 32.9 g/dL (ref 30.0–36.0)
Neutrophils Relative %: 39 % — ABNORMAL LOW (ref 43.0–77.0)
Platelets: 436 10*3/uL — ABNORMAL HIGH (ref 150.0–400.0)
RBC: 4 Mil/uL (ref 3.87–5.11)
WBC: 7.9 10*3/uL (ref 4.5–10.5)

## 2012-10-17 MED ORDER — CIPROFLOXACIN HCL 500 MG PO TABS: 500.0000 mg | ORAL_TABLET | Freq: Two times a day (BID) | ORAL | Status: DC

## 2012-10-17 MED ORDER — TRAMADOL HCL 50 MG PO TABS: 50.0000 mg | ORAL_TABLET | Freq: Four times a day (QID) | ORAL | Status: DC | PRN

## 2012-10-17 MED ORDER — METRONIDAZOLE 500 MG PO TABS: 500.0000 mg | ORAL_TABLET | Freq: Two times a day (BID) | ORAL | Status: DC

## 2012-10-17 MED ORDER — MOVIPREP 100 G PO SOLR: 1.0000 | Freq: Once | ORAL | Status: AC

## 2012-10-17 NOTE — Patient Instructions (Addendum)
Please go to the basement level to have your labs drawn.  We sent prescriptions to [facility]. Tramadol ( Ultram) for pain Flagyl and Cipro.  You have been scheduled for a colonoscopy with propofol. Please follow written instructions given to you at your visit today. If you use inhalers (even only as needed) or a CPAP machine, please bring them with you on the day of your procedure.

## 2012-10-17 NOTE — Progress Notes (Signed)
Subjective:    Patient ID: Brooke Hoffman, female    DOB: 07/11/1971, 41 y.o.   MRN: [id number]  HPI  Brooke Hoffman is a very nice 41-year-old African American female ,new to GI today. She comes in for evaluation of abdominal pain after an ER visit. She has no prior GI history other than a cholecystectomy which was done in 1998. She is otherwise generally healthy. Patient relates onset of her current symptoms about 2 weeks ago and says that she had constant primarily mid and upper abdominal pain for about a week prior to going to the emergency room for evaluation, she said it had gotten to the point where it was hurting to walk. She did not have any associated fever, she did have nausea without vomiting and increased symptoms after eating. She says she was hurting in her midabdomen and around into her back on both sides probably more so on the left. She has chronic problems with constipation and had not noticed any real change in her bowel habits. She did see some bright red blood on one occasion with a hard bowel movement. Evaluation in the emergency room on 10/08/2012 showed WBC of 10.6 hemoglobin 10.7 hematocrit of 33.5 MCV of 86 ,CMET entirely normal. CT scan of the abdomen and pelvis without IV contrast shows gallbladder to be absent, she has focal inflammatory changes in the right upper quadrant of the abdomen surrounding the proximal transverse colon with soft tissue infiltration into the transverse mesocolon: No evidence of perforation or abscess formation. There is a small amount of free fluid in the dependent portion of the pelvis and report was read favoring to represent acute diverticulitis or epiploic appendigitis, she also has a nonobstructing right renal calculus She was placed on a one-week course of Cipro and Flagyl which he is completing today. She says she feels about 60-65% better than she did but her pain is definitely not gone. On further questioning she says that she has been having some  ongoing symptoms over the past several months with a lower level of abdominal discomfort and episodes with increased abdominal pain which she associates with being constipated as she feels somewhat better after she takes a laxative and has a bowel movement .She says she has been having to use more frequent laxatives. She has not been completely pain free for the past few months. Her appetite has been decreased as she generally feels more uncomfortable after eating and she has lost about 20 pounds over the past 6 months unintentionally. She does relate history of chronic anemia, has never required iron. She is still menstruating and usually has been sees lasting 3-4 days which can be heavy at times though she has noted no change in her pattern.    Review of Systems  Constitutional: Positive for appetite change and unexpected weight change.  HENT: Negative.   Eyes: Negative.   Respiratory: Negative.   Cardiovascular: Negative.   Gastrointestinal: Positive for abdominal pain, constipation and blood in stool.  Genitourinary: Negative.   Musculoskeletal: Negative.   Neurological: Negative.   Hematological: Negative.   Psychiatric/Behavioral: Negative.    Outpatient Prescriptions Prior to Visit  Medication Sig Dispense Refill  . ciprofloxacin (CIPRO) 500 MG tablet Take 1 tablet (500 mg total) by mouth every 12 (twelve) hours.  14 tablet  0  . ibuprofen (ADVIL,MOTRIN) 800 MG tablet Take 800 mg by mouth every 8 (eight) hours as needed. For pain.      . metroNIDAZOLE (FLAGYL) 500 MG tablet Take 1   tablet (500 mg total) by mouth 2 (two) times daily. One po bid x 7 days  14 tablet  0      No Known Allergies Active Ambulatory Problems    Diagnosis Date Noted  . No Active Ambulatory Problems   Resolved Ambulatory Problems    Diagnosis Date Noted  . No Resolved Ambulatory Problems   Past Medical History  Diagnosis Date  . Urinary tract infection    History  Substance Use Topics  . Smoking  status: Never Smoker   . Smokeless tobacco: Never Used  . Alcohol Use: No   Family History  Problem Relation Age of Onset  . Diabetes Mother   . Liver cancer Maternal Grandmother      Objective:   Physical Exam  well-developed African American female in no acute distress, pleasant. Blood pressure 110/80 pulse 72 height 5 foot 5 weight 212. HEENT; nontraumatic normocephalic EOMI PERRLA sclera anicteric,Neck; Supple no JVD, Cardiovascular; regular rate and rhythm with S1-S2 no murmur or gallop, Pulmonary; clear bilaterally, Abdomen; soft bowel sounds are active, there is no palpable mass or hepatosplenomegaly, she is tender rather generally but more so in the upper abdomen in the right mid quadrant and hypogastrium is no guarding or rebound., Rectal; exam scant brown stool Hemoccult negative, Extremities; no clubbing cyanosis or edema skin warm and dry, Psych; mood and affect normal and appropriate.        Assessment & Plan:  #1 41-year-old female with 2 week history of acute constant mid and upper abdominal pain and several month history of a lower level abdominal  discomfort, increased constipation decreased appetite and weight loss. CT of the abdomen and pelvis without IV contrast on 10/08/2012 showed focal inflammatory changes in the right upper quadrant in the region of the proximal transverse colon and adjacent mesocolon concerning for an acute diverticulitis or epiploic appendigitis. Her subacute symptoms ongoing over the past few months, and associated weight loss argue for possible IBD, and also cannot rule out neoplasm. She has improved but not resolved her symptoms with a one-week course of antibiotics  #2 Normocytic anemia; chronic #3 status post cholecystectomy  Plan; will extend her antibiotic course out for one more week, with Cipro 500 by mouth twice daily and Flagyl 500 by mouth twice daily. Ultram 50 mg every 4-6 hours as needed for pain Small soft frequent feedings Have  scheduled for colonoscopy with Dr. Gessner on 10/29/2012. Procedure was discussed in detail with the patient and she is agreeable to proceed. She is aware that should she have any worsening of her pain, fever vomiting etc. that she should call for further advice.   

## 2012-10-18 ENCOUNTER — Ambulatory Visit: Payer: Self-pay | Admitting: Physician Assistant

## 2012-10-18 NOTE — Progress Notes (Signed)
Agree with Ms. Esterwood's assessment and plan. Chiyoko Torrico E. Dayron Odland, MD, FACG   

## 2012-10-21 ENCOUNTER — Encounter: Payer: Self-pay | Admitting: Internal Medicine

## 2012-10-29 ENCOUNTER — Ambulatory Visit (HOSPITAL_COMMUNITY): Admit: 2012-10-29 | Payer: Self-pay | Admitting: Internal Medicine

## 2012-10-29 ENCOUNTER — Ambulatory Visit (HOSPITAL_COMMUNITY)
Admission: RE | Admit: 2012-10-29 | Discharge: 2012-10-29 | Disposition: A | Discharge status: Home / Self Care | Payer: Medicaid Other | Source: Ambulatory Visit | Attending: Internal Medicine | Admitting: Internal Medicine

## 2012-10-29 ENCOUNTER — Encounter (HOSPITAL_COMMUNITY): Payer: Self-pay

## 2012-10-29 ENCOUNTER — Encounter (HOSPITAL_COMMUNITY)
Admission: RE | Disposition: A | Discharge status: Home / Self Care | Payer: Self-pay | Source: Ambulatory Visit | Attending: Internal Medicine

## 2012-10-29 DIAGNOSIS — R109 Unspecified abdominal pain: Secondary | ICD-10-CM | POA: Insufficient documentation

## 2012-10-29 DIAGNOSIS — K5900 Constipation, unspecified: Secondary | ICD-10-CM | POA: Insufficient documentation

## 2012-10-29 DIAGNOSIS — R933 Abnormal findings on diagnostic imaging of other parts of digestive tract: Secondary | ICD-10-CM

## 2012-10-29 DIAGNOSIS — K589 Irritable bowel syndrome without diarrhea: Secondary | ICD-10-CM | POA: Diagnosis present

## 2012-10-29 DIAGNOSIS — Z9089 Acquired absence of other organs: Secondary | ICD-10-CM | POA: Insufficient documentation

## 2012-10-29 DIAGNOSIS — D649 Anemia, unspecified: Secondary | ICD-10-CM | POA: Insufficient documentation

## 2012-10-29 HISTORY — PX: COLONOSCOPY: SHX5424

## 2012-10-29 SURGERY — COLONOSCOPY
Anesthesia: Moderate Sedation

## 2012-10-29 MED ORDER — DIPHENHYDRAMINE HCL 50 MG/ML IJ SOLN
INTRAMUSCULAR | Status: AC
  Filled 2012-10-29: qty 1

## 2012-10-29 MED ORDER — MIDAZOLAM HCL 10 MG/2ML IJ SOLN
INTRAMUSCULAR | Status: AC
  Filled 2012-10-29: qty 4

## 2012-10-29 MED ORDER — FENTANYL CITRATE 0.05 MG/ML IJ SOLN
INTRAMUSCULAR | Status: DC | PRN
  Administered 2012-10-29 (×4): 25 ug via INTRAVENOUS

## 2012-10-29 MED ORDER — POLYETHYLENE GLYCOL 3350 17 GM/SCOOP PO POWD: 17.0000 g | Freq: Every day | ORAL | Status: DC

## 2012-10-29 MED ORDER — MIDAZOLAM HCL 10 MG/2ML IJ SOLN
INTRAMUSCULAR | Status: DC | PRN
  Administered 2012-10-29 (×2): 2 mg via INTRAVENOUS
  Administered 2012-10-29: 1 mg via INTRAVENOUS
  Administered 2012-10-29 (×2): 2 mg via INTRAVENOUS

## 2012-10-29 MED ORDER — DICYCLOMINE HCL 20 MG PO TABS: 20.0000 mg | ORAL_TABLET | Freq: Four times a day (QID) | ORAL | Status: DC | PRN

## 2012-10-29 MED ORDER — SODIUM CHLORIDE 0.9 % IV SOLN: INTRAVENOUS | Status: DC

## 2012-10-29 MED ORDER — FENTANYL CITRATE 0.05 MG/ML IJ SOLN
INTRAMUSCULAR | Status: AC
  Filled 2012-10-29: qty 4

## 2012-10-29 NOTE — H&P (View-Only) (Signed)
Subjective:    Patient ID: Brooke Hoffman, female    DOB: 1970-12-23, 41 y.o.   MRN: [id number]  HPI  Brooke Hoffman is a very nice 41 year old [place] American female ,new to GI today. She comes in for evaluation of abdominal pain after an ER visit. She has no prior GI history other than a cholecystectomy which was done in 1998. She is otherwise generally healthy. Patient relates onset of her current symptoms about 2 weeks ago and says that she had constant primarily mid and upper abdominal pain for about a week prior to going to the emergency room for evaluation, she said it had gotten to the point where it was hurting to walk. She did not have any associated fever, she did have nausea without vomiting and increased symptoms after eating. She says she was hurting in her midabdomen and around into her back on both sides probably more so on the left. She has chronic problems with constipation and had not noticed any real change in her bowel habits. She did see some bright red blood on one occasion with a hard bowel movement. Evaluation in the emergency room on 10/08/2012 showed WBC of 10.6 hemoglobin 10.7 hematocrit of 33.5 MCV of 86 ,CMET entirely normal. CT scan of the abdomen and pelvis without IV contrast shows gallbladder to be absent, she has focal inflammatory changes in the right upper quadrant of the abdomen surrounding the proximal transverse colon with soft tissue infiltration into the transverse mesocolon: No evidence of perforation or abscess formation. There is a small amount of free fluid in the dependent portion of the pelvis and report was read favoring to represent acute diverticulitis or epiploic appendigitis, she also has a nonobstructing right renal calculus She was placed on a one-week course of Cipro and Flagyl which he is completing today. She says she feels about 60-65% better than she did but her pain is definitely not gone. On further questioning she says that she has been having some  ongoing symptoms over the past several months with a lower level of abdominal discomfort and episodes with increased abdominal pain which she associates with being constipated as she feels somewhat better after she takes a laxative and has a bowel movement .She says she has been having to use more frequent laxatives. She has not been completely pain free for the past few months. Her appetite has been decreased as she generally feels more uncomfortable after eating and she has lost about 20 pounds over the past 6 months unintentionally. She does relate history of chronic anemia, has never required iron. She is still menstruating and usually has been sees lasting 3-4 days which can be heavy at times though she has noted no change in her pattern.    Review of Systems  Constitutional: Positive for appetite change and unexpected weight change.  HENT: Negative.   Eyes: Negative.   Respiratory: Negative.   Cardiovascular: Negative.   Gastrointestinal: Positive for abdominal pain, constipation and blood in stool.  Genitourinary: Negative.   Musculoskeletal: Negative.   Neurological: Negative.   Hematological: Negative.   Psychiatric/Behavioral: Negative.    Outpatient Prescriptions Prior to Visit  Medication Sig Dispense Refill  . ciprofloxacin (CIPRO) 500 MG tablet Take 1 tablet (500 mg total) by mouth every 12 (twelve) hours.  14 tablet  0  . ibuprofen (ADVIL,MOTRIN) 800 MG tablet Take 800 mg by mouth every 8 (eight) hours as needed. For pain.      . metroNIDAZOLE (FLAGYL) 500 MG tablet Take 1  tablet (500 mg total) by mouth 2 (two) times daily. One po bid x 7 days  14 tablet  0      No Known Allergies Active Ambulatory Problems    Diagnosis Date Noted  . No Active Ambulatory Problems   Resolved Ambulatory Problems    Diagnosis Date Noted  . No Resolved Ambulatory Problems   Past Medical History  Diagnosis Date  . Urinary tract infection    History  Substance Use Topics  . Smoking  status: Never Smoker   . Smokeless tobacco: Never Used  . Alcohol Use: No   Family History  Problem Relation Age of Onset  . Diabetes Mother   . Liver cancer Maternal Grandmother      Objective:   Physical Exam  well-developed African American female in no acute distress, pleasant. Blood pressure 110/80 pulse 72 height 5 foot 5 weight 212. HEENT; nontraumatic normocephalic EOMI PERRLA sclera anicteric,Neck; Supple no JVD, Cardiovascular; regular rate and rhythm with S1-S2 no murmur or gallop, Pulmonary; clear bilaterally, Abdomen; soft bowel sounds are active, there is no palpable mass or hepatosplenomegaly, she is tender rather generally but more so in the upper abdomen in the right mid quadrant and hypogastrium is no guarding or rebound., Rectal; exam scant brown stool Hemoccult negative, Extremities; no clubbing cyanosis or edema skin warm and dry, Psych; mood and affect normal and appropriate.        Assessment & Plan:  #58 41 year old female with 2 week history of acute constant mid and upper abdominal pain and several month history of a lower level abdominal  discomfort, increased constipation decreased appetite and weight loss. CT of the abdomen and pelvis without IV contrast on 10/08/2012 showed focal inflammatory changes in the right upper quadrant in the region of the proximal transverse colon and adjacent mesocolon concerning for an acute diverticulitis or epiploic appendigitis. Her subacute symptoms ongoing over the past few months, and associated weight loss argue for possible IBD, and also cannot rule out neoplasm. She has improved but not resolved her symptoms with a one-week course of antibiotics  #2 Normocytic anemia; chronic #3 status post cholecystectomy  Plan; will extend her antibiotic course out for one more week, with Cipro 500 by mouth twice daily and Flagyl 500 by mouth twice daily. Ultram 50 mg every 4-6 hours as needed for pain Small soft frequent feedings Have  scheduled for colonoscopy with Dr. Leone Payor on 10/29/2012. Procedure was discussed in detail with the patient and she is agreeable to proceed. She is aware that should she have any worsening of her pain, fever vomiting etc. that she should call for further advice.

## 2012-10-29 NOTE — Op Note (Addendum)
[facility] [address] [place], [address]   COLONOSCOPY PROCEDURE REPORT  PATIENT: Brooke Hoffman, Brooke Hoffman  MR#: [id number] BIRTHDATE: Dec 01, 1970 , 41  yrs. old GENDER: Female ENDOSCOPIST: Iva Boop, MD, [facility] PROCEDURE DATE:  10/29/2012 PROCEDURE:   Colonoscopy, diagnostic ASA CLASS:   Class II INDICATIONS:an abnormal CT. MEDICATIONS: Fentanyl 100 mcg IV and Versed 9 mg IV  DESCRIPTION OF PROCEDURE:   After the risks benefits and alternatives of the procedure were thoroughly explained, informed consent was obtained.  A digital rectal exam revealed no abnormalities of the rectum.   The Pentax Colonoscope [id number] endoscope was introduced through the anus and advanced to the cecum, which was identified by both the appendix and ileocecal valve. No adverse events experienced.   The quality of the prep was Suprep excellent  The instrument was then slowly withdrawn as the colon was fully examined.      COLON FINDINGS: The colonic mucosa appeared normal throughoutthe entire colon. The cecum was seen and entered but not deeply due to looping, despite pressure and position changes.   Retroflexed views revealed no abnormalities. The time to cecum=9 minutes 0 seconds. Withdrawal time=11 minutes 0 seconds.  The scope was withdrawn and the procedure completed. COMPLICATIONS: There were no complications.  ENDOSCOPIC IMPRESSION: The colonic mucosa appeared normal  RECOMMENDATIONS: 1.  Call office for follow-up appointment in 6 weeks 2.  Repeat colonoscopy 10 years.   eSigned:  Iva Boop, MD, [facility] 10/29/2012 12:58 PM   cc: The Patient

## 2012-10-29 NOTE — Interval H&P Note (Signed)
History and Physical Interval Note:  10/29/2012 12:01 PM  Brooke Hoffman  has presented today for surgery, with the diagnosis of Abdominal pain [789.00] Weight loss [783.21] Abnormal CT scan [793.99]  The various methods of treatment have been discussed with the patient and family. After consideration of risks, benefits and other options for treatment, the patient has consented to  Procedure(s) (LRB) with comments: COLONOSCOPY (N/A) as a surgical intervention .  The patient's history has been reviewed, patient examined, no change in status, stable for surgery.  I have reviewed the patient's chart and labs.  Questions were answered to the patient's satisfaction.     Stan Head

## 2012-10-30 ENCOUNTER — Encounter (HOSPITAL_COMMUNITY): Payer: Self-pay

## 2012-10-30 ENCOUNTER — Encounter (HOSPITAL_COMMUNITY): Payer: Self-pay | Admitting: Internal Medicine

## 2012-11-15 ENCOUNTER — Ambulatory Visit: Payer: Self-pay | Admitting: Internal Medicine

## 2012-12-10 ENCOUNTER — Ambulatory Visit: Payer: Self-pay | Admitting: Internal Medicine

## 2012-12-30 ENCOUNTER — Ambulatory Visit (INDEPENDENT_AMBULATORY_CARE_PROVIDER_SITE_OTHER): Payer: Medicaid Other | Admitting: Internal Medicine

## 2012-12-30 ENCOUNTER — Encounter: Payer: Self-pay | Admitting: Internal Medicine

## 2012-12-30 VITALS — BP 108/70 | HR 88 | Ht 65.0 in | Wt 213.8 lb

## 2012-12-30 DIAGNOSIS — R109 Unspecified abdominal pain: Secondary | ICD-10-CM

## 2012-12-30 DIAGNOSIS — R933 Abnormal findings on diagnostic imaging of other parts of digestive tract: Secondary | ICD-10-CM

## 2012-12-30 DIAGNOSIS — R1010 Upper abdominal pain, unspecified: Secondary | ICD-10-CM

## 2012-12-30 DIAGNOSIS — R1031 Right lower quadrant pain: Secondary | ICD-10-CM

## 2012-12-30 NOTE — Progress Notes (Signed)
  Subjective:    Patient ID: Brooke Hoffman, female    DOB: Jan 20, 1971, 42 y.o.   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 She has been seen for upper abdominal pain and some inflammatory changes in the area right colon or mesocolon on a CT in November. A colonoscopy did not show any problems. She did have a redundant colon I was able to only partially enter her cecum. She was using some MiraLax after that and felt better but she continues to have intermittent upper abdominal pain and has an ache in her right lower quadrant. Abdomen all and or dicyclomine do provide some relief. She has backed off on using the MiraLax she's having some more constipation with small balls of stool at times. Weight is stable. No urinary symptoms. Medications, allergies, past medical history, past surgical history, family history and social history are reviewed and updated in the EMR. Review of Systems As above    Objective:   Physical Exam Well developed well-nourished overweight middle-aged Abdomen is soft, mildly tender in the right lower quadrant area, some of this is worse with hip flexion and rotation of the hip. She is tender in the iliac crest as well.      Assessment & Plan:   1. Upper abdominal pain   2. RLQ abdominal pain   3. Abnormal finding on GI tract imaging    1. She may very well have IBS the constipation predominant problem. However I don't think we fully explain the abnormality seen on her CT scan though it was an unenhanced scan and it could be artifact. 2. Schedule CT abdomen pelvis with IV and oral contrast to better clarify and check for resolution of the abnormal findings as mentioned in the November CT scan report. 3. MiraLax daily, titrated for effect. 4. Further plans pending these results and clinical course.

## 2012-12-30 NOTE — Patient Instructions (Addendum)
You have been scheduled for a CT scan of the abdomen and pelvis at [facility] ([address]---this is in the same building as [facility] [employer]).   You are scheduled on 01/03/13 at 2:30pm. You should arrive 15 minutes prior to your appointment time for registration. Please follow the written instructions below on the day of your exam:  WARNING: IF YOU ARE ALLERGIC TO IODINE/X-RAY DYE, PLEASE NOTIFY RADIOLOGY IMMEDIATELY AT [phone]! YOU WILL BE GIVEN A 13 HOUR PREMEDICATION PREP.  1) Do not eat or drink anything after 10:30 (4 hours prior to your test) 2) You have been given 2 bottles of oral contrast to drink. The solution may taste               better if refrigerated, but do NOT add ice or any other liquid to this solution. Shake             well before drinking.    Drink 1 bottle of contrast @ 12:30pm (2 hours prior to your exam)  Drink 1 bottle of contrast @ 1:30pm (1 hour prior to your exam)  You may take any medications as prescribed with a small amount of water except for the following: Metformin, Glucophage, Glucovance, Avandamet, Riomet, Fortamet, Actoplus Met, Janumet, Glumetza or Metaglip. The above medications must be held the day of the exam AND 48 hours after the exam.  The purpose of you drinking the oral contrast is to aid in the visualization of your intestinal tract. The contrast solution may cause some diarrhea. Before your exam is started, you will be given a small amount of fluid to drink. Depending on your individual set of symptoms, you may also receive an intravenous injection of x-ray contrast/dye. Plan on being at [facility] for 30 minutes or long, depending on the type of exam you are having performed.  This test typically takes 30-45 minutes to complete.  If you have any questions regarding your exam or if you need to reschedule, you may call the CT department at [phone] between the hours of 8:00 am and 5:00 pm, Monday-Friday.  Thank  you for choosing me and  Gastroenterology.  Iva Boop, M.D., [facility]  ________________________________________________________________________

## 2013-01-03 ENCOUNTER — Ambulatory Visit (INDEPENDENT_AMBULATORY_CARE_PROVIDER_SITE_OTHER)
Admission: RE | Admit: 2013-01-03 | Discharge: 2013-01-03 | Disposition: A | Discharge status: Home / Self Care | Payer: Medicaid Other | Source: Ambulatory Visit | Attending: Internal Medicine | Admitting: Internal Medicine

## 2013-01-03 DIAGNOSIS — R109 Unspecified abdominal pain: Secondary | ICD-10-CM

## 2013-01-03 DIAGNOSIS — R933 Abnormal findings on diagnostic imaging of other parts of digestive tract: Secondary | ICD-10-CM

## 2013-01-03 DIAGNOSIS — R1010 Upper abdominal pain, unspecified: Secondary | ICD-10-CM

## 2013-01-03 DIAGNOSIS — R1031 Right lower quadrant pain: Secondary | ICD-10-CM

## 2013-01-03 MED ORDER — IOHEXOL 300 MG/ML  SOLN
100.0000 mL | Freq: Once | INTRAMUSCULAR | Status: AC | PRN
  Administered 2013-01-03: 100 mL via INTRAVENOUS

## 2013-01-03 NOTE — Progress Notes (Signed)
Quick Note:  Let her know CT is ok Try the MiraLax and let me know if that is not helping adequately ______

## 2013-01-04 ENCOUNTER — Encounter (HOSPITAL_COMMUNITY): Payer: Self-pay | Admitting: Emergency Medicine

## 2013-01-04 ENCOUNTER — Emergency Department (HOSPITAL_COMMUNITY)
Admission: EM | Admit: 2013-01-04 | Discharge: 2013-01-04 | Disposition: A | Discharge status: Home / Self Care | Payer: Medicaid Other | Attending: Emergency Medicine | Admitting: Emergency Medicine

## 2013-01-04 DIAGNOSIS — R590 Localized enlarged lymph nodes: Secondary | ICD-10-CM

## 2013-01-04 DIAGNOSIS — R599 Enlarged lymph nodes, unspecified: Secondary | ICD-10-CM | POA: Insufficient documentation

## 2013-01-04 DIAGNOSIS — J029 Acute pharyngitis, unspecified: Secondary | ICD-10-CM | POA: Insufficient documentation

## 2013-01-04 DIAGNOSIS — Z862 Personal history of diseases of the blood and blood-forming organs and certain disorders involving the immune mechanism: Secondary | ICD-10-CM | POA: Insufficient documentation

## 2013-01-04 NOTE — ED Notes (Signed)
Pt states she awoke today with throat swelling, bilat, c/o pain to same. Pt states she had a CT AB/Pelvis yesterday with contrast. No immediate reaction noted from dye. Pt denies tongue swelling or diff breathing.

## 2013-01-04 NOTE — ED Provider Notes (Signed)
History    This chart was scribed for non-physician practitioner working with Gerhard Munch, MD by Burman Nieves. This patient was seen in room WTR5/WTR5 and the patient's care was started at 10:32 PM.  CSN: [id number]  Arrival date & time 01/04/13  2058      Chief Complaint  Patient presents with  . Lymphadenopathy    (Consider location/radiation/quality/duration/timing/severity/associated sxs/prior treatment) Patient is a 42 y.o. female presenting with pharyngitis. The history is provided by the patient. No language interpreter was used.  Sore Throat This is a new problem. The current episode started 6 to 12 hours ago. The problem occurs constantly. The problem has not changed since onset.Pertinent negatives include no headaches and no shortness of breath. Nothing aggravates the symptoms. Nothing relieves the symptoms. She has tried nothing for the symptoms.   Brooke Hoffman is a 42 y.o. female who presents to the Emergency Department complaining of constant moderate anterior cervical lymphoadenopathy onset today. She has moderate pain with swallowing. Pt denies difficulty swallowing, swelling of tongue, lips, or throat, fever, chills, cough, nausea, vomiting, diarrhea, SOB, weakness, rash and any other associated symptoms. She is concerned that she may be having an allergic reaction to the contrast from a CT scan of her ab/pelvis that was performed yesterday.  No prior allergy to contrast.      Past Medical History  Diagnosis Date  . Anemia     Past Surgical History  Procedure Laterality Date  . Cholecystectomy  1998  . Breast reduction surgery    . Tubal ligation    . Colonoscopy  10/29/2012    Procedure: COLONOSCOPY;  Surgeon: Iva Boop, MD;  Location: [facility];  Service: Endoscopy;  Laterality: N/A;    Family History  Problem Relation Age of Onset  . Diabetes Mother   . Liver cancer Maternal Grandmother   . Colon cancer Neg Hx   . Heart disease Paternal  Grandfather   . Kidney disease Maternal Uncle     History  Substance Use Topics  . Smoking status: Never Smoker   . Smokeless tobacco: Never Used  . Alcohol Use: No    OB History   Grav Para Term Preterm Abortions TAB SAB Ect Mult Living   4 4 4  0 0 0 0 0 0 4      Review of Systems  Constitutional: Negative for fever and chills.  HENT: Positive for sore throat. Negative for congestion, rhinorrhea, drooling, trouble swallowing, neck pain, neck stiffness and sinus pressure.   Respiratory: Negative for shortness of breath.   Gastrointestinal: Negative for nausea and vomiting.  Neurological: Negative for weakness and headaches.  All other systems reviewed and are negative.    Allergies  Review of patient's allergies indicates no known allergies.  Home Medications   Current Outpatient Rx  Name  Route  Sig  Dispense  Refill  . ciprofloxacin (CIPRO) 500 MG tablet   Oral   Take 500 mg by mouth 2 (two) times daily.         Marland Kitchen dicyclomine (BENTYL) 20 MG tablet   Oral   Take 20 mg by mouth every 6 (six) hours as needed (abdominal pain, cramps).         . metroNIDAZOLE (FLAGYL) 500 MG tablet   Oral   Take 500 mg by mouth 2 (two) times daily.         . polyethylene glycol powder (GLYCOLAX/MIRALAX) powder   Oral   Take 17 g by mouth every morning.         Marland Kitchen  traMADol (ULTRAM) 50 MG tablet   Oral   Take 50 mg by mouth every 6 (six) hours as needed for pain (for pain).           BP 121/76  Pulse 76  Temp(Src) 98.3 F (36.8 C) (Oral)  Resp 16  Ht 5\' 5"  (1.651 m)  Wt 200 lb (90.719 kg)  BMI 33.28 kg/m2  SpO2 100%  LMP 01/01/2013  Physical Exam  Nursing note and vitals reviewed. Constitutional: She is oriented to person, place, and time. She appears well-developed and well-nourished. No distress.  HENT:  Head: Normocephalic and atraumatic.  Right Ear: Tympanic membrane, external ear and ear canal normal.  Left Ear: Tympanic membrane, external ear and ear  canal normal.  Nose: Nose normal. Right sinus exhibits no maxillary sinus tenderness and no frontal sinus tenderness. Left sinus exhibits no maxillary sinus tenderness and no frontal sinus tenderness.  Mouth/Throat: Uvula is midline, oropharynx is clear and moist and mucous membranes are normal. No oropharyngeal exudate or posterior oropharyngeal edema.  Anterior cervical lymphadenopathy which is bilateral  Eyes: EOM are normal. Pupils are equal, round, and reactive to light.  Neck: Normal range of motion. Neck supple. No tracheal deviation present. No thyromegaly present.  Cardiovascular: Normal rate, regular rhythm and normal heart sounds.   Pulmonary/Chest: Effort normal and breath sounds normal. No respiratory distress.  Abdominal: Soft. She exhibits no distension.  Musculoskeletal: Normal range of motion.  Lymphadenopathy:    She has cervical adenopathy.  Neurological: She is alert and oriented to person, place, and time.  Skin: Skin is warm and dry. No rash noted.  Psychiatric: She has a normal mood and affect. Her behavior is normal.    ED Course  Procedures (including critical care time) DIAGNOSTIC STUDIES: Oxygen Saturation is 100% on room air, normal by my interpretation.    COORDINATION OF CARE:   10:42 PM Discussed ED treatment with pt and pt agrees.        Labs Reviewed - No data to display Ct Abdomen Pelvis W Contrast  01/03/2013  *RADIOLOGY REPORT*  Clinical Data: Upper abdominal pain and right lower quadrant pain mouth, history of irritable bowel syndrome  CT ABDOMEN AND PELVIS WITH CONTRAST  Technique:  Multidetector CT imaging of the abdomen and pelvis was performed following the standard protocol during bolus administration of intravenous contrast.  Contrast: OMNIPAQUE IOHEXOL 300 MG/ML  SOLN  Comparison: CT abdomen pelvis of 10/08/2012  Findings: The lung bases are clear.  The liver enhances with no focal abnormality and no ductal dilatation is seen.   Surgical clips are present from prior cholecystectomy.  The pancreas is normal in size and the pancreatic duct is not dilated.  The adrenal glands and spleen are unremarkable.  The stomach is not well distended. The kidneys enhance and there is a simple appearing cyst in the lower pole of the left kidney posterior medially. There are tiny renal calculi bilaterally better seen on the coronal images, but no hydronephrosis is seen.  The abdominal aorta is normal in caliber. No adenopathy is seen.  The uterus is normal in size. There appear to be surgical clips present probably due to bilateral tubal ligation.  No adnexal lesion is seen.  No free fluid is noted within the pelvis.  The urinary bladder is unremarkable.  The previous inflammatory process involving the proximal transverse colon has resolved.  No mucosal edema of the colon is seen.  The cecum, terminal ileum, and appendix are unremarkable. No  skeletal abnormality is seen.  IMPRESSION:  1.  Tiny nonobstructing renal calculi.  No hydronephrosis. 2.  No edema of large or small bowel is seen.  The terminal ileum and the appendix are unremarkable.   Original Report Authenticated By: Dwyane Dee, M.D.      No diagnosis found.    MDM  Patient presents with a chief complaint of lymphadenopathy.  She was concerned that she was having an allergic reaction to the contrast from her CT ab/pelvis that was performed yesterday.  No evidence of an allergic reaction.  She does have mild anterior cervical lymphadenopathy.  No evidence of strep throat, AOM, AOE, or Acute sinusitis at this time.  Suspect viral infection.    I personally performed the services described in this documentation, which was scribed in my presence. The recorded information has been reviewed and is accurate.    Pascal Lux Pullman, PA-C 01/05/13 1126

## 2013-01-05 NOTE — ED Provider Notes (Signed)
  Medical screening examination/treatment/procedure(s) were performed by non-physician practitioner and as supervising physician I was immediately available for consultation/collaboration.    Toby Breithaupt, MD 01/05/13 2121 

## 2013-01-11 ENCOUNTER — Other Ambulatory Visit: Payer: Self-pay

## 2013-03-09 ENCOUNTER — Emergency Department (HOSPITAL_COMMUNITY)
Admission: EM | Admit: 2013-03-09 | Discharge: 2013-03-09 | Disposition: A | Discharge status: Home / Self Care | Payer: Medicaid Other | Attending: Emergency Medicine | Admitting: Emergency Medicine

## 2013-03-09 ENCOUNTER — Encounter (HOSPITAL_COMMUNITY): Payer: Self-pay

## 2013-03-09 DIAGNOSIS — Z862 Personal history of diseases of the blood and blood-forming organs and certain disorders involving the immune mechanism: Secondary | ICD-10-CM | POA: Insufficient documentation

## 2013-03-09 DIAGNOSIS — Z9851 Tubal ligation status: Secondary | ICD-10-CM | POA: Insufficient documentation

## 2013-03-09 DIAGNOSIS — R109 Unspecified abdominal pain: Secondary | ICD-10-CM | POA: Insufficient documentation

## 2013-03-09 DIAGNOSIS — R197 Diarrhea, unspecified: Secondary | ICD-10-CM | POA: Insufficient documentation

## 2013-03-09 DIAGNOSIS — Z9089 Acquired absence of other organs: Secondary | ICD-10-CM | POA: Insufficient documentation

## 2013-03-09 DIAGNOSIS — Z3202 Encounter for pregnancy test, result negative: Secondary | ICD-10-CM | POA: Insufficient documentation

## 2013-03-09 LAB — COMPREHENSIVE METABOLIC PANEL
ALT: 16 U/L (ref 0–35)
AST: 22 U/L (ref 0–37)
Alkaline Phosphatase: 82 U/L (ref 39–117)
GFR calc Af Amer: >90 mL/min (ref >90)
Glucose, Bld: 86 mg/dL (ref 70–99)
Potassium: 3.8 mEq/L (ref 3.5–5.1)
Sodium: 138 mEq/L (ref 135–145)
Total Protein: 8.2 g/dL (ref 6.0–8.3)

## 2013-03-09 LAB — CBC WITH DIFFERENTIAL/PLATELET
Basophils Absolute: 0 10*3/uL (ref 0.0–0.1)
Eosinophils Absolute: 0.1 10*3/uL (ref 0.0–0.7)
Lymphocytes Relative: 51 % — ABNORMAL HIGH (ref 12–46)
Lymphs Abs: 3 10*3/uL (ref 0.7–4.0)
MCH: 28.6 pg (ref 26.0–34.0)
Neutrophils Relative %: 35 % — ABNORMAL LOW (ref 43–77)
Platelets: 416 10*3/uL — ABNORMAL HIGH (ref 150–400)
RBC: 4.13 MIL/uL (ref 3.87–5.11)
RDW: 12.5 % (ref 11.5–15.5)
WBC: 6 10*3/uL (ref 4.0–10.5)

## 2013-03-09 LAB — URINALYSIS, ROUTINE W REFLEX MICROSCOPIC
Bilirubin Urine: NEGATIVE
Glucose, UA: NEGATIVE mg/dL
Hgb urine dipstick: NEGATIVE
Specific Gravity, Urine: 1.026 (ref 1.005–1.030)
Urobilinogen, UA: 1 mg/dL (ref 0.0–1.0)
pH: 6 (ref 5.0–8.0)

## 2013-03-09 LAB — PREGNANCY, URINE: Preg Test, Ur: NEGATIVE

## 2013-03-09 MED ORDER — SODIUM CHLORIDE 0.9 % IV BOLUS (SEPSIS)
1000.0000 mL | Freq: Once | INTRAVENOUS | Status: AC
  Administered 2013-03-09: 1000 mL via INTRAVENOUS

## 2013-03-09 MED ORDER — DICYCLOMINE HCL 10 MG/ML IM SOLN
20.0000 mg | Freq: Once | INTRAMUSCULAR | Status: AC
  Administered 2013-03-09: 20 mg via INTRAMUSCULAR
  Filled 2013-03-09: qty 2

## 2013-03-09 MED ORDER — SODIUM CHLORIDE 0.9 % IV SOLN
INTRAVENOUS | Status: DC
  Administered 2013-03-09: 16:00:00 via INTRAVENOUS

## 2013-03-09 NOTE — ED Provider Notes (Signed)
History     CSN: [id number]  Arrival date & time 03/09/13  1353   First MD Initiated Contact with Patient 03/09/13 1417      Chief Complaint  Patient presents with  . Diarrhea  . Abdominal Pain    (Consider location/radiation/quality/duration/timing/severity/associated sxs/prior treatment) Patient is a 42 y.o. female presenting with diarrhea. The history is provided by the patient. No language interpreter was used.  Diarrhea Quality:  Watery Severity:  Mild Onset quality:  Gradual Number of episodes:  Multiple Duration:  2 days Timing:  Constant Progression:  Unchanged Relieved by:  Nothing Worsened by:  Nothing tried Ineffective treatments:  None tried   Past Medical History  Diagnosis Date  . Anemia     Past Surgical History  Procedure Laterality Date  . Cholecystectomy  1998  . Breast reduction surgery    . Tubal ligation    . Colonoscopy  10/29/2012    Procedure: COLONOSCOPY;  Surgeon: Iva Boop, MD;  Location: [facility];  Service: Endoscopy;  Laterality: N/A;    Family History  Problem Relation Age of Onset  . Diabetes Mother   . Liver cancer Maternal Grandmother   . Colon cancer Neg Hx   . Heart disease Paternal Grandfather   . Kidney disease Maternal Uncle     History  Substance Use Topics  . Smoking status: Never Smoker   . Smokeless tobacco: Never Used  . Alcohol Use: No    OB History   Grav Para Term Preterm Abortions TAB SAB Ect Mult Living   4 4 4  0 0 0 0 0 0 4      Review of Systems  Gastrointestinal: Positive for diarrhea.    Allergies  Review of patient's allergies indicates no known allergies.  Home Medications   Current Outpatient Rx  Name  Route  Sig  Dispense  Refill  . dicyclomine (BENTYL) 20 MG tablet   Oral   Take 20 mg by mouth every 6 (six) hours as needed (abdominal pain, cramps).         Marland Kitchen ibuprofen (ADVIL,MOTRIN) 800 MG tablet   Oral   Take 800 mg by mouth every 8 (eight) hours as needed for pain.           BP 102/68  Pulse 80  Temp(Src) 98.6 F (37 C) (Oral)  Resp 18  SpO2 100%  LMP 03/02/2013  Physical Exam  ED Course  Procedures (including critical care time)  Labs Reviewed  CBC WITH DIFFERENTIAL - Abnormal; Notable for the following:    Hemoglobin 11.8 (*)    HCT 35.8 (*)    Platelets 416 (*)    Neutrophils Relative 35 (*)    Lymphocytes Relative 51 (*)    All other components within normal limits  URINALYSIS, ROUTINE W REFLEX MICROSCOPIC - Abnormal; Notable for the following:    APPearance CLOUDY (*)    All other components within normal limits  COMPREHENSIVE METABOLIC PANEL  LIPASE, BLOOD  PREGNANCY, URINE   No results found.   1. Diarrhea       MDM  Pt is feeling better at this time:symptoms likely viral:no medications needed:no recent antibiotics or travel        Teressa Lower, NP 03/09/13 1604

## 2013-03-09 NOTE — ED Notes (Signed)
Patient reports diarrhea x 2 days with abdominal pain. Patient denies any blood in her stool. Patient also c/o headache and body cramping.

## 2013-03-09 NOTE — ED Provider Notes (Signed)
History     CSN: [id number]  Arrival date & time 03/09/13  1353   First MD Initiated Contact with Patient 03/09/13 1417      Chief Complaint  Patient presents with  . Diarrhea  . Abdominal Pain    (Consider location/radiation/quality/duration/timing/severity/associated sxs/prior treatment) HPI Comments: Pt state that she started with diarrhea times 2 days ago:pt states that other member of her family had similar symptoms:pt state that she has not had fever:pt states that she came in because she was having cramping and a headache:pt denies dysuria,vomiting, cp or sob  The history is provided by the patient. No language interpreter was used.    Past Medical History  Diagnosis Date  . Anemia     Past Surgical History  Procedure Laterality Date  . Cholecystectomy  1998  . Breast reduction surgery    . Tubal ligation    . Colonoscopy  10/29/2012    Procedure: COLONOSCOPY;  Surgeon: Iva Boop, MD;  Location: [facility];  Service: Endoscopy;  Laterality: N/A;    Family History  Problem Relation Age of Onset  . Diabetes Mother   . Liver cancer Maternal Grandmother   . Colon cancer Neg Hx   . Heart disease Paternal Grandfather   . Kidney disease Maternal Uncle     History  Substance Use Topics  . Smoking status: Never Smoker   . Smokeless tobacco: Never Used  . Alcohol Use: No    OB History   Grav Para Term Preterm Abortions TAB SAB Ect Mult Living   4 4 4  0 0 0 0 0 0 4      Review of Systems  Constitutional: Negative.   Respiratory: Negative.   Cardiovascular: Negative.     Allergies  Review of patient's allergies indicates no known allergies.  Home Medications   Current Outpatient Rx  Name  Route  Sig  Dispense  Refill  . dicyclomine (BENTYL) 20 MG tablet   Oral   Take 20 mg by mouth every 6 (six) hours as needed (abdominal pain, cramps).         Marland Kitchen ibuprofen (ADVIL,MOTRIN) 800 MG tablet   Oral   Take 800 mg by mouth every 8 (eight) hours as  needed for pain.           BP 102/68  Pulse 80  Temp(Src) 98.6 F (37 C) (Oral)  Resp 18  SpO2 100%  LMP 03/02/2013  Physical Exam  Nursing note and vitals reviewed. Constitutional: She is oriented to person, place, and time. She appears well-developed and well-nourished.  HENT:  Head: Normocephalic and atraumatic.  Eyes: Conjunctivae and EOM are normal.  Neck: Normal range of motion. Neck supple.  Cardiovascular: Normal rate and regular rhythm.   Pulmonary/Chest: Effort normal and breath sounds normal.  Abdominal: Soft. Bowel sounds are normal. There is no tenderness.  Musculoskeletal: Normal range of motion.  Neurological: She is alert and oriented to person, place, and time.  Skin: Skin is warm and dry.  Psychiatric: She has a normal mood and affect.    ED Course  Procedures (including critical care time)  Labs Reviewed  CBC WITH DIFFERENTIAL - Abnormal; Notable for the following:    Hemoglobin 11.8 (*)    HCT 35.8 (*)    Platelets 416 (*)    Neutrophils Relative 35 (*)    Lymphocytes Relative 51 (*)    All other components within normal limits  URINALYSIS, ROUTINE W REFLEX MICROSCOPIC - Abnormal; Notable for the  following:    APPearance CLOUDY (*)    All other components within normal limits  COMPREHENSIVE METABOLIC PANEL  LIPASE, BLOOD  PREGNANCY, URINE   No results found.   1. Diarrhea       MDM  Pt is feeling better at this time:pt is not vomiting:likely viral        Teressa Lower, NP 03/09/13 [id number]

## 2013-03-10 NOTE — ED Provider Notes (Signed)
Medical screening examination/treatment/procedure(s) were performed by non-physician practitioner and as supervising physician I was immediately available for consultation/collaboration.  Jade Burkard T Ahonesty Woodfin, MD 03/10/13 0808 

## 2013-03-10 NOTE — ED Provider Notes (Signed)
Medical screening examination/treatment/procedure(s) were performed by non-physician practitioner and as supervising physician I was immediately available for consultation/collaboration.  Armanii Pressnell T Hellon Vaccarella, MD 03/10/13 0808 

## 2013-03-12 LAB — GLUCOSE, CAPILLARY: Glucose-Capillary: 85 mg/dL (ref 70–99)

## 2013-09-18 ENCOUNTER — Encounter (HOSPITAL_COMMUNITY): Payer: Self-pay | Admitting: Emergency Medicine

## 2013-09-18 ENCOUNTER — Emergency Department (HOSPITAL_COMMUNITY)
Admission: EM | Admit: 2013-09-18 | Discharge: 2013-09-18 | Disposition: A | Discharge status: Home / Self Care | Payer: Medicaid Other | Source: Home / Self Care | Attending: Family Medicine | Admitting: Family Medicine

## 2013-09-18 DIAGNOSIS — J069 Acute upper respiratory infection, unspecified: Secondary | ICD-10-CM

## 2013-09-18 LAB — POCT RAPID STREP A: Streptococcus, Group A Screen (Direct): NEGATIVE

## 2013-09-18 NOTE — ED Provider Notes (Signed)
Brooke Hoffman is a 42 y.o. female who presents to Urgent Care today for sore throat cough and chills for about one week. Patient scheduled over-the-counter medications which help. No nausea vomiting diarrhea. Patient additionally notes a mild headache. Her son has a similar illness. Her last menstrual period was October 13.    Past Medical History  Diagnosis Date  . Anemia    History  Substance Use Topics  . Smoking status: Never Smoker   . Smokeless tobacco: Never Used  . Alcohol Use: No   ROS as above Medications reviewed. No current facility-administered medications for this encounter.   Current Outpatient Prescriptions  Medication Sig Dispense Refill  . dicyclomine (BENTYL) 20 MG tablet Take 20 mg by mouth every 6 (six) hours as needed (abdominal pain, cramps).      Marland Kitchen ibuprofen (ADVIL,MOTRIN) 800 MG tablet Take 800 mg by mouth every 8 (eight) hours as needed for pain.        Exam:  BP 118/84  Pulse 63  Temp(Src) 98.3 F (36.8 C) (Oral)  Resp 18  SpO2 100%  LMP 09/08/2013 Gen: Well NAD HEENT: EOMI,  MMM, normal-appearing tympanic membranes bilaterally. Posterior pharynx with cobblestoning. Lungs: CTABL Nl WOB Heart: RRR no MRG Abd: NABS, NT, ND Exts: Non edematous BL  LE, warm and well perfused.   Results for orders placed during the hospital encounter of 09/18/13 (from the past 24 hour(s))  POCT RAPID STREP A ([facility] ONLY)     Status: None   Collection Time    09/18/13  6:28 PM      Result Value Range   Streptococcus, Group A Screen (Direct) NEGATIVE  NEGATIVE   No results found.  Assessment and Plan: 42 y.o. female with viral URI. Plan to use symptomatic management with Tylenol and ibuprofen. Followup as needed. Discussed warning signs or symptoms. Please see discharge instructions. Patient expresses understanding.      Rodolph Bong, MD 09/18/13 [phone]

## 2013-09-18 NOTE — ED Notes (Signed)
C/o sore throat x 1 week with chills.  No runny nose cough or earache but does have mild headache.

## 2013-09-21 ENCOUNTER — Telehealth (HOSPITAL_COMMUNITY): Payer: Self-pay | Admitting: Family Medicine

## 2013-09-21 LAB — CULTURE, GROUP A STREP

## 2013-09-21 MED ORDER — AMOXICILLIN 500 MG PO CAPS: 1000.0000 mg | ORAL_CAPSULE | Freq: Two times a day (BID) | ORAL | Status: AC

## 2013-09-21 NOTE — ED Notes (Signed)
Strep throat culture positive. I called in amoxicillin I left a message on the patient's voicemail.  Rodolph Bong, MD 09/21/13 2015

## 2013-09-30 ENCOUNTER — Telehealth (HOSPITAL_COMMUNITY): Payer: Self-pay | Admitting: *Deleted

## 2013-09-30 NOTE — ED Notes (Addendum)
11/3 I called pt. and left a message to call.   11/4 Left message. Call 2. 11/6 Left message. Call 3.  She called back a short time later.  Pt. verified x 2 and given results.  Pt. told she need Amoxicillin and where to pick up her Rx.  Pt. Said she does not work at [employer]. I told her to call back if I need to call it in to another pharmacy. Cherly Anderson M

## 2013-10-02 ENCOUNTER — Other Ambulatory Visit: Payer: Self-pay

## 2013-10-06 ENCOUNTER — Encounter: Payer: Self-pay | Admitting: Family Medicine

## 2013-10-06 ENCOUNTER — Ambulatory Visit (INDEPENDENT_AMBULATORY_CARE_PROVIDER_SITE_OTHER): Payer: Medicaid Other | Admitting: Family Medicine

## 2013-10-06 VITALS — BP 114/78 | HR 61 | Ht 65.0 in | Wt 208.5 lb

## 2013-10-06 DIAGNOSIS — M25569 Pain in unspecified knee: Secondary | ICD-10-CM

## 2013-10-06 DIAGNOSIS — Z23 Encounter for immunization: Secondary | ICD-10-CM

## 2013-10-06 DIAGNOSIS — M25561 Pain in right knee: Secondary | ICD-10-CM

## 2013-10-06 NOTE — Patient Instructions (Signed)
Nice to meet you today! We are getting some xrays of your knees. Follow up in 3-4 weeks. I recommend you go back to see your eye doctor for another vision checkup.  Marland Kitchenbwell

## 2013-10-21 ENCOUNTER — Encounter: Payer: Self-pay | Admitting: Family Medicine

## 2013-10-21 NOTE — Progress Notes (Signed)
Patient ID: Brooke Hoffman, female   DOB: 03/30/1971, 42 y.o.   MRN: [id number]   HPI:  Patient presents today for a new patient appointment to establish general primary care, also to discuss knee pain.  She's had chronic pain in both of her knees but the right side is worse than the left. The pain is located lateral to her patellas bilaterally. She has noticed cracking-type sounds whenever she gets up, and has pain with standing up as well. She also feels a sensation of needles and pins at night. She is worried that with her symptoms, she could have multiple sclerosis (has read about this online). She is wondering if she needs to be tested for MS. She has no family hx of MS but her mom does have fibromyalgia. She wears high heels every day to work but does not think that her shoes are contributing. She has tried wearing flats but that just made the pain worse.  ROS: See HPI. Full ROS sheet positive for muscle cramps/aches, joint pain/swelling, and numbness. She also endorses frequent headaches.  Past Medical Hx: chronic leg pain. Irritable bowel syndrome. No hx of hospitalizations. Does not take any medications other than bentyl prn, OTC advil/tylenol, muscle rub for legs. No allergies.   Vision changes - patient also endorsed a hx of problems with vision. We did not fully address this today. She reports having had problems with far vision. Last eye exam was in 2013. Occasionally sees light specks since last year.   Past Surgical Hx: Breast reduction in 1998, cholecystectomy in 1998, tubal ligation in 2002  Family Hx: updated in Epic  Social Hx: Lives with son (16) and daughter (12). She owns a car. No religious beliefs affecting her care. She does cardio & strength exercising 3 times per week. Denies any hx of tobacco use. Does not use drugs or alcohol. Feels safe in her relationships. Denies any problems with mood. Denies being at risk for STD's.   Health maintenance:  - reports having had  normal pap in 2013 at an office in [facility] (doesn't know the name of the office) - had colonoscopy in 2013 (did not discuss why this happened), tetanus shot in 2013  - Had a mammogram years ago  PHYSICAL EXAM: BP 114/78  Pulse 61  Ht 5\' 5"  (1.651 m)  Wt 208 lb 8 oz (94.575 kg)  BMI 34.70 kg/m2  LMP 10/03/2013 Gen: NAD Heart: RRR Lungs: CTAB Neuro: grossly nonfocal, speech intact Ext: no palpable nodules on her legs. No crepitus in either knee. No joint effusion or erythema. Full passive ROM.   ASSESSMENT/PLAN:  # Knee pain: precepted with Dr. Jennette Kettle. I am uncertain of exactly what is causing her pain. I could not appreciate any crepitus on her exam. Per Dr. Donnetta Hail advice, will start workup with plain films by obtaining bilateral standing AP films of each knee, with sunrise view to evaluate for joint space narrowing. Question of possible patellofemoral syndrome. Will have her f/u in 3-4 weeks to review results and decide on further workup. I reassured her that I don't think she has MS based on the symptoms she has reported today.  # Vision concerns: did not address this today, but documented above hx for future reference. Doubt light specks are retinal detachment since they are intermittent and have occurred for many months. I recommended she go back to her eye doctor for another checkup.   FOLLOW UP: F/u in 3-4 weeks for knee pain.  [place] J. Pollie Meyer,  MD [facility]

## 2013-10-24 ENCOUNTER — Other Ambulatory Visit: Payer: Self-pay | Admitting: Family Medicine

## 2013-10-24 ENCOUNTER — Ambulatory Visit (HOSPITAL_COMMUNITY)
Admission: RE | Admit: 2013-10-24 | Discharge: 2013-10-24 | Disposition: A | Discharge status: Home / Self Care | Payer: Medicaid Other | Source: Ambulatory Visit | Attending: Family Medicine | Admitting: Family Medicine

## 2013-10-24 DIAGNOSIS — M25561 Pain in right knee: Secondary | ICD-10-CM

## 2013-10-24 DIAGNOSIS — M25569 Pain in unspecified knee: Secondary | ICD-10-CM | POA: Insufficient documentation

## 2013-10-29 ENCOUNTER — Telehealth: Payer: Self-pay | Admitting: *Deleted

## 2013-10-29 NOTE — Telephone Encounter (Signed)
Left message to return call. Please let patient know hr knee x-rays were normal.Busick, Rodena Medin

## 2013-11-12 ENCOUNTER — Ambulatory Visit: Payer: Medicaid Other | Admitting: Family Medicine

## 2014-03-05 ENCOUNTER — Emergency Department (HOSPITAL_COMMUNITY)
Admission: EM | Admit: 2014-03-05 | Discharge: 2014-03-05 | Disposition: A | Discharge status: Home / Self Care | Payer: Medicaid Other | Source: Home / Self Care | Attending: Family Medicine | Admitting: Family Medicine

## 2014-03-05 ENCOUNTER — Other Ambulatory Visit (HOSPITAL_COMMUNITY)
Admission: RE | Admit: 2014-03-05 | Discharge: 2014-03-05 | Disposition: A | Discharge status: Home / Self Care | Payer: Medicaid Other | Source: Ambulatory Visit | Attending: Family Medicine | Admitting: Family Medicine

## 2014-03-05 ENCOUNTER — Encounter (HOSPITAL_COMMUNITY): Payer: Self-pay | Admitting: Emergency Medicine

## 2014-03-05 DIAGNOSIS — N760 Acute vaginitis: Secondary | ICD-10-CM

## 2014-03-05 DIAGNOSIS — Z113 Encounter for screening for infections with a predominantly sexual mode of transmission: Secondary | ICD-10-CM | POA: Insufficient documentation

## 2014-03-05 DIAGNOSIS — A499 Bacterial infection, unspecified: Secondary | ICD-10-CM

## 2014-03-05 DIAGNOSIS — B9689 Other specified bacterial agents as the cause of diseases classified elsewhere: Secondary | ICD-10-CM

## 2014-03-05 MED ORDER — METRONIDAZOLE 0.75 % VA GEL: 1.0000 | Freq: Every day | VAGINAL | Status: AC

## 2014-03-05 MED ORDER — METRONIDAZOLE 500 MG PO TABS: 500.0000 mg | ORAL_TABLET | Freq: Two times a day (BID) | ORAL | Status: AC

## 2014-03-05 NOTE — Discharge Instructions (Signed)
Use medicine as prescribed, we will call if tests show a need for other treatment.  

## 2014-03-05 NOTE — ED Provider Notes (Signed)
CSN: [id number]     Arrival date & time 03/05/14  1754 History   First MD Initiated Contact with Patient 03/05/14 1937     Chief Complaint  Patient presents with  . Vaginal Discharge   (Consider location/radiation/quality/duration/timing/severity/associated sxs/prior Treatment) Patient is a 43 y.o. female presenting with vaginal discharge. The history is provided by the patient.  Vaginal Discharge Quality:  Yellow and malodorous Severity:  Moderate Onset quality:  Gradual Duration:  3 months Chronicity:  New Associated symptoms: no abdominal pain, no dysuria and no genital lesions     Past Medical History  Diagnosis Date  . Anemia    Past Surgical History  Procedure Laterality Date  . Cholecystectomy  1998  . Breast reduction surgery    . Tubal ligation    . Colonoscopy  10/29/2012    Procedure: COLONOSCOPY;  Surgeon: Iva Booparl E Gessner, MD;  Location: [facility];  Service: Endoscopy;  Laterality: N/A;   Family History  Problem Relation Age of Onset  . Diabetes Mother     also in grandparent and aunt/uncle  . Hypertension Mother   . Liver cancer Maternal Grandmother   . Colon cancer Neg Hx   . Heart disease Paternal Grandfather   . Kidney disease Maternal Uncle   . Hypertension Father   . Stomach cancer      grandparent  . Heart attack      grandparent  . Hyperlipidemia Father   . Stroke      grandparent  . Hyperthyroidism Mother    History  Substance Use Topics  . Smoking status: Never Smoker   . Smokeless tobacco: Never Used  . Alcohol Use: No   OB History   Grav Para Term Preterm Abortions TAB SAB Ect Mult Living   4 4 4  0 0 0 0 0 0 4     Review of Systems  Constitutional: Negative.   Gastrointestinal: Negative for abdominal pain.  Genitourinary: Positive for vaginal discharge. Negative for dysuria, frequency and vaginal bleeding.    Allergies  Review of patient's allergies indicates no known allergies.  Home Medications   Current Outpatient Rx    Name  Route  Sig  Dispense  Refill  . amoxicillin (AMOXIL) 500 MG capsule   Oral   Take 2 capsules (1,000 mg total) by mouth 2 (two) times daily.   40 capsule   0   . dicyclomine (BENTYL) 20 MG tablet   Oral   Take 20 mg by mouth every 6 (six) hours as needed (abdominal pain, cramps).         Marland Kitchen. ibuprofen (ADVIL,MOTRIN) 800 MG tablet   Oral   Take 800 mg by mouth every 8 (eight) hours as needed for pain.         . metroNIDAZOLE (FLAGYL) 500 MG tablet   Oral   Take 1 tablet (500 mg total) by mouth 2 (two) times daily.   14 tablet   0   . metroNIDAZOLE (METROGEL VAGINAL) 0.75 % vaginal gel   Vaginal   Place 1 Applicatorful vaginally at bedtime.   70 g   0    BP 120/61  Pulse 64  Temp(Src) 98.1 F (36.7 C) (Oral)  Resp 16  SpO2 99% Physical Exam  Nursing note and vitals reviewed. Constitutional: She is oriented to person, place, and time.  Abdominal: Soft. Bowel sounds are normal. There is no tenderness.  Genitourinary: Uterus normal.    Pelvic exam was performed with patient supine. Cervix exhibits no motion  tenderness and no friability. Right adnexum displays no mass, no tenderness and no fullness. Left adnexum displays no mass, no tenderness and no fullness. No tenderness around the vagina. Vaginal discharge found.  Neurological: She is alert and oriented to person, place, and time.  Skin: Skin is warm and dry.    ED Course  Procedures (including critical care time) Labs Review Labs Reviewed  CERVICOVAGINAL ANCILLARY ONLY   Imaging Review No results found.   MDM   1. BV (bacterial vaginosis)        Linna Hoff, MD 03/05/14 2150

## 2014-03-05 NOTE — ED Notes (Signed)
Patient complains of vaginal discharge with an odor

## 2014-03-06 LAB — CERVICOVAGINAL ANCILLARY ONLY
CHLAMYDIA, DNA PROBE: NEGATIVE
NEISSERIA GONORRHEA: NEGATIVE
WET PREP (BD AFFIRM): NEGATIVE
Wet Prep (BD Affirm): NEGATIVE
Wet Prep (BD Affirm): POSITIVE — AB

## 2014-03-10 NOTE — ED Notes (Signed)
GC/Chlamydia neg., Affirm: Candida and Trich neg., Gardnerella pos.  Pt. adequately treated with Flagyl. Brooke Hoffman M [facility] 03/10/2014

## 2014-06-03 IMAGING — CT CT ABD-PELV W/O CM
1 series · 15 of 27 positions shown, 19 images · non-contrast
Comparison: 06/17/2009

CLINICAL DATA: Urinary tract infection.  Flank pain

CT ABDOMEN AND PELVIS WITHOUT CONTRAST
TECHNIQUE: Multidetector CT imaging of the abdomen and pelvis was
performed following the standard protocol without intravenous
contrast.

[Series 4: lung · axial · 0.68mm/px · z∈[-252,-132]mm · 15 of 27 slices shown, 19 images]
[im 2/27  soft-tissue]
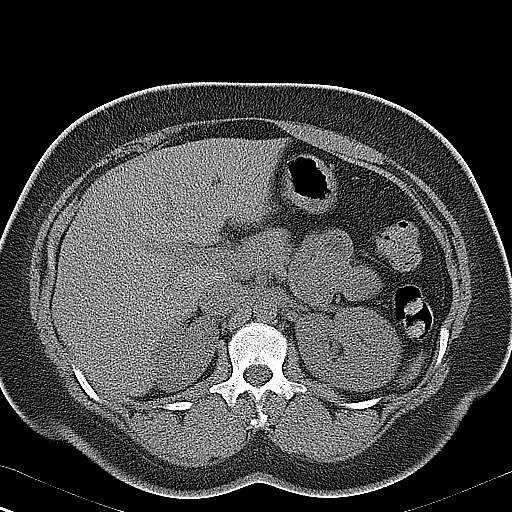
[im 2/27  bone]
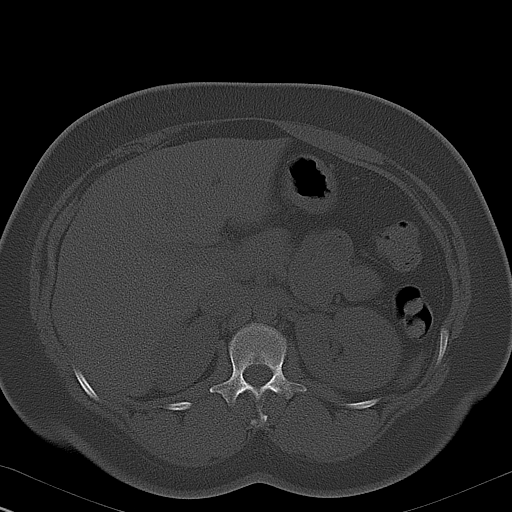
[im 4/27  soft-tissue]
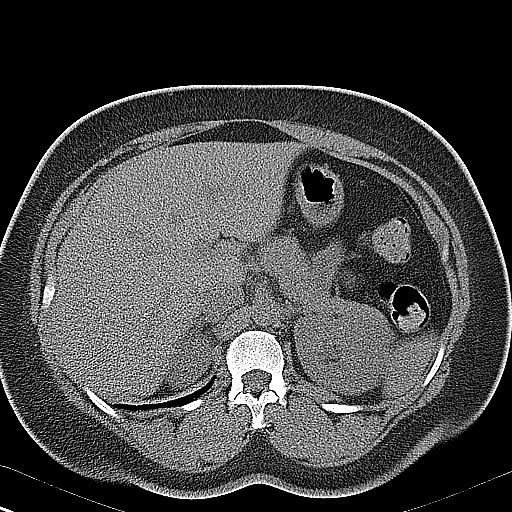
[im 6/27  soft-tissue]
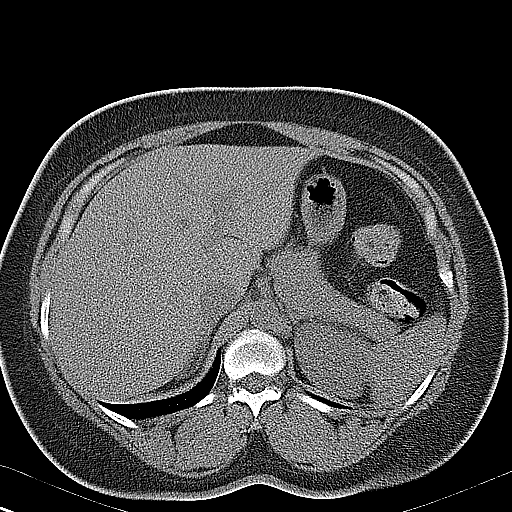
[im 8/27  soft-tissue]
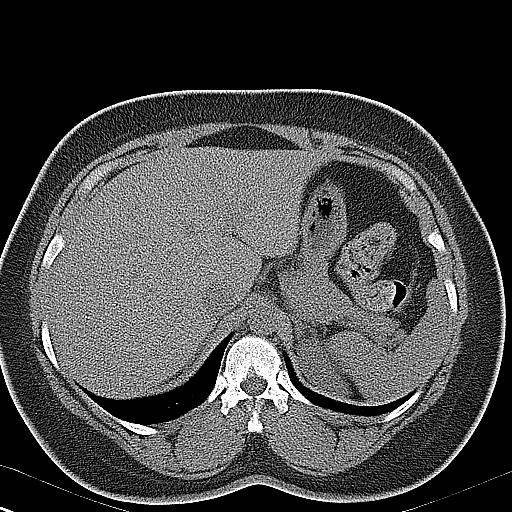
[im 10/27  soft-tissue]
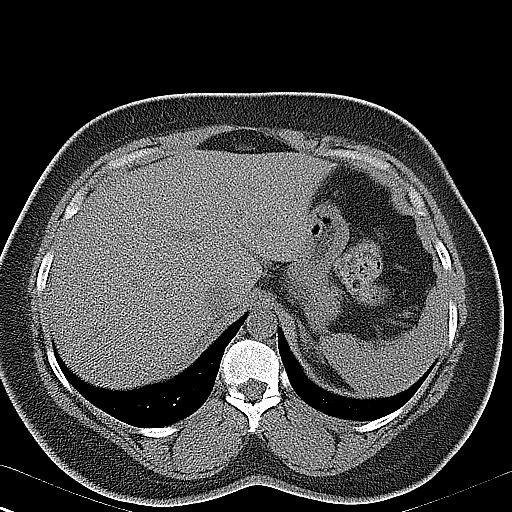
[im 12/27  soft-tissue]
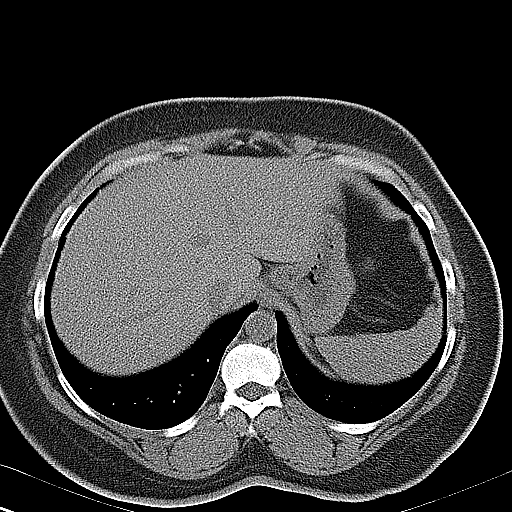
[im 14/27  soft-tissue]
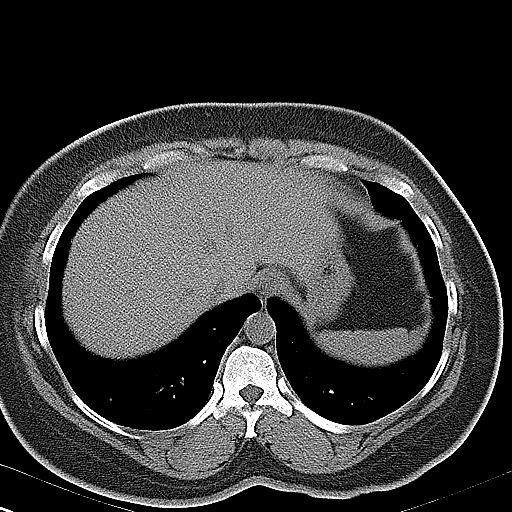
[im 16/27  soft-tissue]
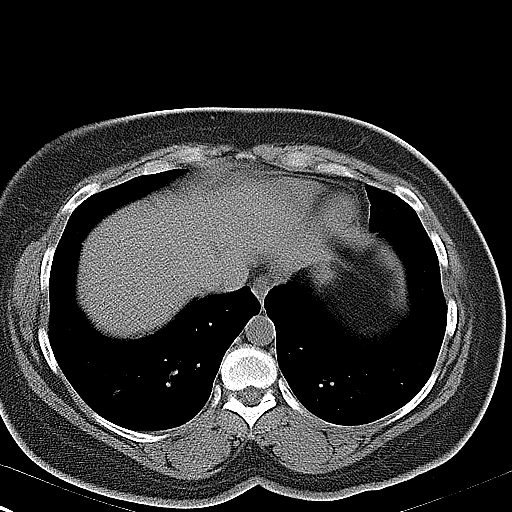
[im 18/27  soft-tissue]
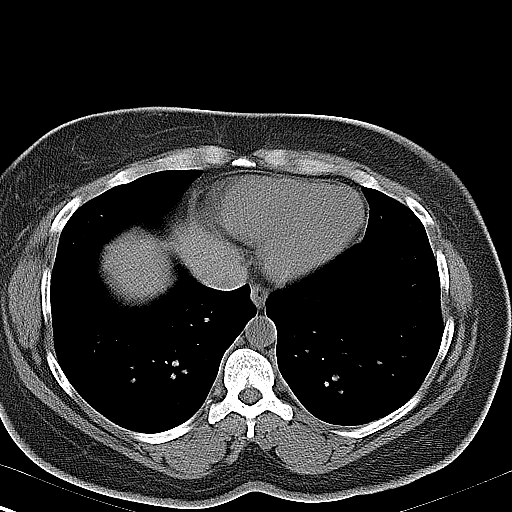
[im 18/27  bone]
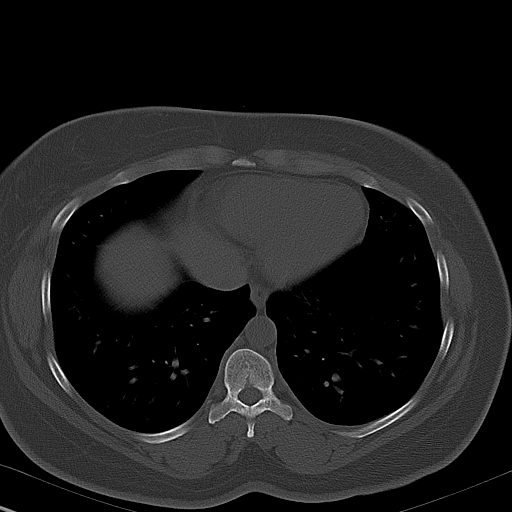
[im 20/27  soft-tissue]
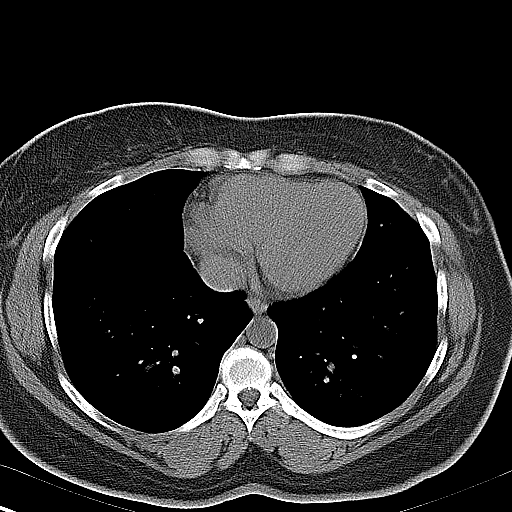
[im 22/27  soft-tissue]
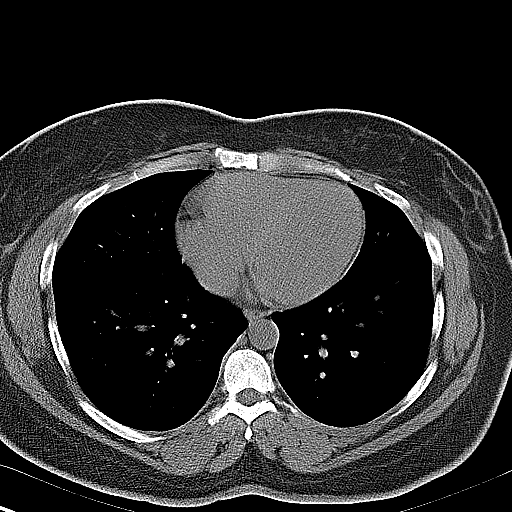
[im 23/27  lung]
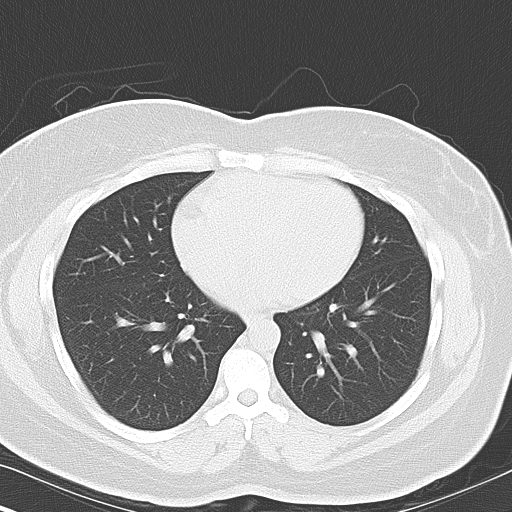
[im 24/27  soft-tissue]
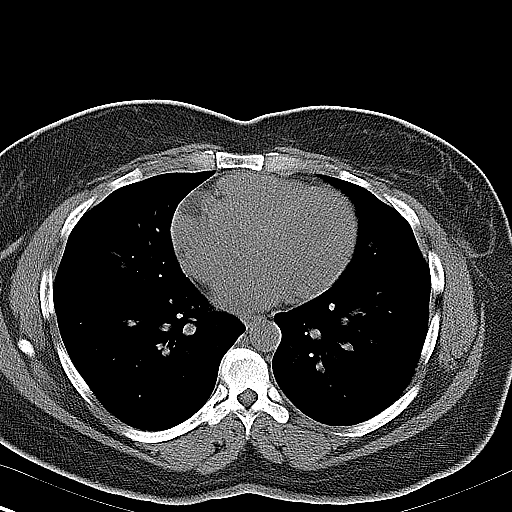
[im 24/27  lung]
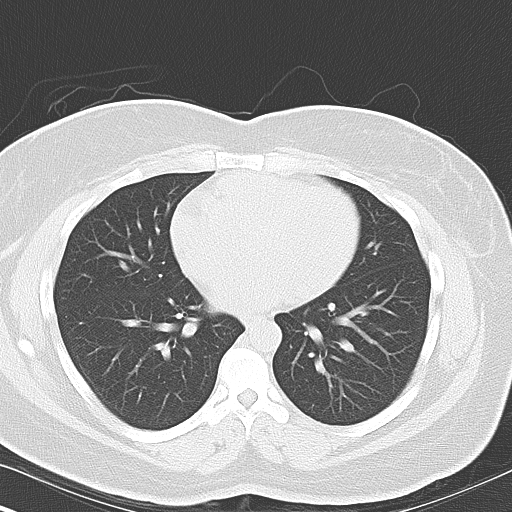
[im 25/27  lung]
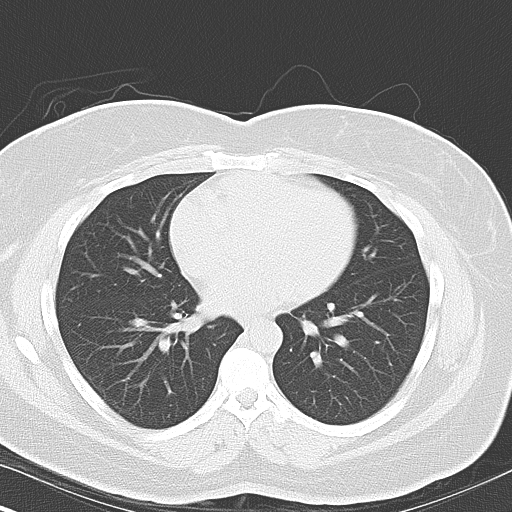
[im 26/27  soft-tissue]
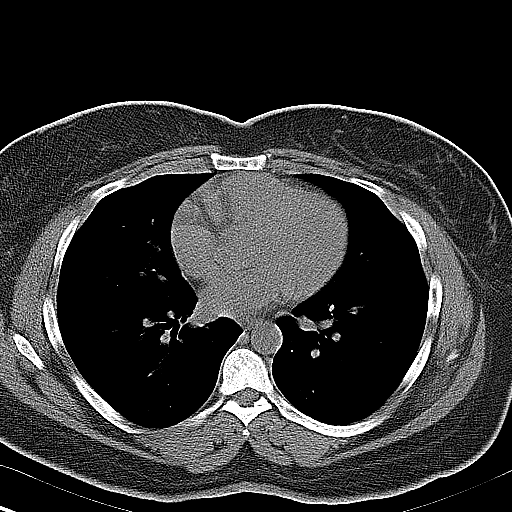
[im 26/27  lung]
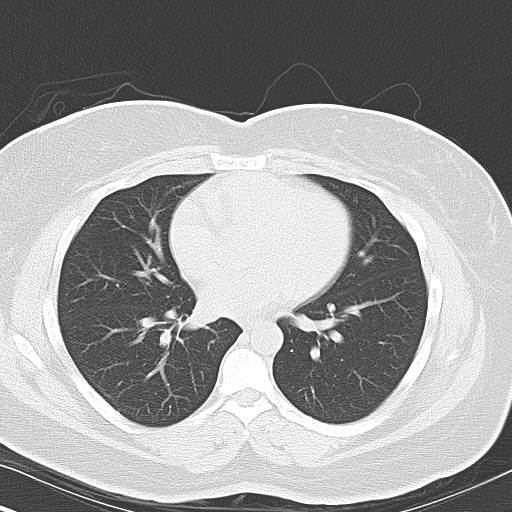

[15 of 27 positions shown; findings below may reference images not displayed]

FINDINGS: Lung bases:  Heart size appears normal.  There is no pericardial or
pleural effusion.

Abdomen/pelvis:  No focal liver abnormality.  Prior
cholecystectomy.  The pancreas appears within normal limits.  There
is no biliary dilatation.  Normal appearance of the spleen.
Both adrenal glands identified and appear normal.  Nonobstructing
calculus is identified within the inferior pole of the right kidney
measuring 2.7 mm, image 34.  There is a hypodense structure within
the inferior pole of the left kidney.  Likely cyst but incompletely
characterized without IV contrast.  No left-sided nephrolithiasis
or hydronephrosis.  The urinary bladder appears normal.  Uterus is
unremarkable.

There are no enlarged upper abdominal lymph nodes.  There is no
pelvic or inguinal adenopathy identified.  The stomach appears
normal.  The small bowel loops are unremarkable.  The appendix is
visualized and appears normal.  Focal inflammatory changes are
noted within the right upper quadrant of the abdomen surrounding
the proximal transverse colon, image 35.  These changes include
soft tissue infiltration into the transverse mesocolon.  No
significant perforation or abscess formation.

Small amount of free fluid is noted within the dependent portion of
the pelvis.  No abscess noted.

Bones/Musculoskeletal:  The visualized osseous structures are
unremarkable.
IMPRESSION: 1.  Focal inflammatory changes within the right upper quadrant of
the abdomen in the region of the proximal transverse colon and
adjacent mesocolon findings are favored to represent acute
diverticulitis or epiploic appendagitis.  No abscess formation
identified.
2.  Nonobstructing right renal calculus.

## 2014-09-28 ENCOUNTER — Encounter (HOSPITAL_COMMUNITY): Payer: Self-pay | Admitting: Emergency Medicine
